# Patient Record
Sex: Female | Born: 1960 | Race: Asian | Hispanic: No | Marital: Single | Smoking: Never smoker
Health system: Southern US, Community
[De-identification: ages and names within clinical notes are randomized; demographics above are authoritative.]

## PROBLEM LIST (undated history)

## (undated) ENCOUNTER — Inpatient Hospital Stay (HOSPITAL_COMMUNITY): Payer: Self-pay

## (undated) DIAGNOSIS — E7800 Pure hypercholesterolemia, unspecified: Secondary | ICD-10-CM

## (undated) DIAGNOSIS — R7989 Other specified abnormal findings of blood chemistry: Secondary | ICD-10-CM

## (undated) DIAGNOSIS — R809 Proteinuria, unspecified: Secondary | ICD-10-CM

## (undated) DIAGNOSIS — R945 Abnormal results of liver function studies: Secondary | ICD-10-CM

## (undated) DIAGNOSIS — J309 Allergic rhinitis, unspecified: Secondary | ICD-10-CM

## (undated) DIAGNOSIS — C801 Malignant (primary) neoplasm, unspecified: Secondary | ICD-10-CM

## (undated) DIAGNOSIS — I10 Essential (primary) hypertension: Secondary | ICD-10-CM

## (undated) DIAGNOSIS — L309 Dermatitis, unspecified: Secondary | ICD-10-CM

## (undated) DIAGNOSIS — E119 Type 2 diabetes mellitus without complications: Secondary | ICD-10-CM

## (undated) DIAGNOSIS — N6019 Diffuse cystic mastopathy of unspecified breast: Secondary | ICD-10-CM

## (undated) DIAGNOSIS — E785 Hyperlipidemia, unspecified: Secondary | ICD-10-CM

## (undated) DIAGNOSIS — K219 Gastro-esophageal reflux disease without esophagitis: Secondary | ICD-10-CM

## (undated) HISTORY — DX: Pure hypercholesterolemia, unspecified: E78.00

## (undated) HISTORY — DX: Essential (primary) hypertension: I10

## (undated) HISTORY — DX: Proteinuria, unspecified: R80.9

## (undated) HISTORY — DX: Diffuse cystic mastopathy of unspecified breast: N60.19

## (undated) HISTORY — DX: Allergic rhinitis, unspecified: J30.9

## (undated) HISTORY — DX: Dermatitis, unspecified: L30.9

## (undated) HISTORY — DX: Abnormal results of liver function studies: R94.5

## (undated) HISTORY — PX: BREAST BIOPSY: SHX20

## (undated) HISTORY — DX: Hyperlipidemia, unspecified: E78.5

## (undated) HISTORY — DX: Gastro-esophageal reflux disease without esophagitis: K21.9

## (undated) HISTORY — DX: Other specified abnormal findings of blood chemistry: R79.89

## (undated) HISTORY — DX: Type 2 diabetes mellitus without complications: E11.9

---

## 2003-10-04 ENCOUNTER — Other Ambulatory Visit: Admission: RE | Admit: 2003-10-04 | Discharge: 2003-10-04 | Payer: Self-pay | Admitting: Family Medicine

## 2006-02-27 ENCOUNTER — Other Ambulatory Visit: Admission: RE | Admit: 2006-02-27 | Discharge: 2006-02-27 | Payer: Self-pay | Admitting: Family Medicine

## 2007-03-04 ENCOUNTER — Other Ambulatory Visit: Admission: RE | Admit: 2007-03-04 | Discharge: 2007-03-04 | Payer: Self-pay | Admitting: Family Medicine

## 2007-08-18 ENCOUNTER — Other Ambulatory Visit: Admission: RE | Admit: 2007-08-18 | Discharge: 2007-08-18 | Payer: Self-pay | Admitting: Family Medicine

## 2010-06-03 ENCOUNTER — Other Ambulatory Visit: Admission: RE | Admit: 2010-06-03 | Discharge: 2010-06-03 | Payer: Self-pay | Admitting: Family Medicine

## 2011-06-18 ENCOUNTER — Other Ambulatory Visit: Payer: Self-pay | Admitting: Family Medicine

## 2011-06-18 ENCOUNTER — Other Ambulatory Visit (HOSPITAL_COMMUNITY)
Admission: RE | Admit: 2011-06-18 | Discharge: 2011-06-18 | Disposition: A | Discharge status: Home / Self Care | Payer: Managed Care, Other (non HMO) | Source: Ambulatory Visit | Attending: Family Medicine | Admitting: Family Medicine

## 2011-06-18 DIAGNOSIS — Z01419 Encounter for gynecological examination (general) (routine) without abnormal findings: Secondary | ICD-10-CM | POA: Insufficient documentation

## 2012-09-13 ENCOUNTER — Other Ambulatory Visit (HOSPITAL_COMMUNITY)
Admission: RE | Admit: 2012-09-13 | Discharge: 2012-09-13 | Disposition: A | Discharge status: Home / Self Care | Payer: Managed Care, Other (non HMO) | Source: Ambulatory Visit | Attending: Family Medicine | Admitting: Family Medicine

## 2012-09-13 ENCOUNTER — Other Ambulatory Visit: Payer: Self-pay | Admitting: Family Medicine

## 2012-09-13 DIAGNOSIS — Z01419 Encounter for gynecological examination (general) (routine) without abnormal findings: Secondary | ICD-10-CM | POA: Insufficient documentation

## 2013-05-15 ENCOUNTER — Other Ambulatory Visit: Payer: Self-pay | Admitting: *Deleted

## 2013-05-15 DIAGNOSIS — E782 Mixed hyperlipidemia: Secondary | ICD-10-CM

## 2013-06-21 ENCOUNTER — Ambulatory Visit: Payer: Self-pay | Admitting: Podiatrist

## 2013-06-22 ENCOUNTER — Other Ambulatory Visit: Payer: Managed Care, Other (non HMO)

## 2013-06-24 ENCOUNTER — Ambulatory Visit: Payer: Managed Care, Other (non HMO) | Admitting: Pharmacist

## 2013-06-28 ENCOUNTER — Encounter: Payer: Self-pay | Admitting: Podiatrist

## 2013-06-28 ENCOUNTER — Ambulatory Visit (INDEPENDENT_AMBULATORY_CARE_PROVIDER_SITE_OTHER): Payer: Managed Care, Other (non HMO) | Admitting: Podiatrist

## 2013-06-28 VITALS — BP 99/59 | HR 82 | Resp 16

## 2013-06-28 DIAGNOSIS — M722 Plantar fascial fibromatosis: Secondary | ICD-10-CM

## 2013-06-28 NOTE — Patient Instructions (Signed)
Call if the orthotics aren't feeling better-- I could send them back to the lab to have them refurbished and the arches lifted even further if they need it.

## 2013-06-28 NOTE — Progress Notes (Signed)
° °  Subjective: Patient presents today stating that her orthotics have fallen a bit in the arch and her feet are starting to hurt. She received the orthotics in November of 2013 when Dr. Leeanne Deed recommended these for her plantar fasciitis. She states they have been working very well in her tennis shoes however recently they don't feel  as supportive in the arch area.  Objective: Neurovascular status is intact and unchanged from her previous visit. Neurological sensation is intact in pedal pulses are palpable. Mild plantar fasciitis symptomatology is elicited. The orthotics contour well against her arch however in the central aspect of the foot they could use some elevation.  Assessment: Plantar fasciitis which is being treated with orthotics  Plan: Adjusted the orthotics to the best of my ability to elevate the central portion of the insert. If these are not comfortable I discussed I can send him back to the lab for refurbishing and to add some arch padding. We could also consider  Orthotics if she would like. The patient will try these orthotics and will call if any adjustments need to be performed.  Marlowe Aschoff DPM

## 2013-06-30 ENCOUNTER — Other Ambulatory Visit: Payer: Self-pay | Admitting: Cardiology

## 2013-06-30 ENCOUNTER — Other Ambulatory Visit (INDEPENDENT_AMBULATORY_CARE_PROVIDER_SITE_OTHER): Payer: Managed Care, Other (non HMO)

## 2013-06-30 DIAGNOSIS — E782 Mixed hyperlipidemia: Secondary | ICD-10-CM

## 2013-07-01 LAB — NMR LIPOPROFILE WITH LIPIDS
Cholesterol, Total: 101 mg/dL (ref <200)
HDL Particle Number: 22.3 umol/L — ABNORMAL LOW (ref >=30.5)
LDL (calc): 51 mg/dL (ref <100)
LDL Particle Number: 1021 nmol/L — ABNORMAL HIGH (ref <1000)
Large HDL-P: 3 umol/L — ABNORMAL LOW (ref >=4.8)
Large VLDL-P: 2.6 nmol/L (ref <=2.7)
Small LDL Particle Number: 740 nmol/L — ABNORMAL HIGH (ref <=527)
VLDL Size: 45.5 nm (ref <=46.6)

## 2013-07-05 ENCOUNTER — Ambulatory Visit (INDEPENDENT_AMBULATORY_CARE_PROVIDER_SITE_OTHER): Payer: Managed Care, Other (non HMO) | Admitting: Pharmacist

## 2013-07-05 VITALS — Wt 146.0 lb

## 2013-07-05 DIAGNOSIS — Z79899 Other long term (current) drug therapy: Secondary | ICD-10-CM

## 2013-07-05 DIAGNOSIS — E785 Hyperlipidemia, unspecified: Secondary | ICD-10-CM

## 2013-07-05 NOTE — Assessment & Plan Note (Addendum)
Cholesterol much improved over past 6 months since adding metamucil 2 capsules bid to regimen.  LDL-P down from 1200 nmol/L to 1000 nmol/L at this time.  She is tolerating Crestor/Zetia/fish oil/metamcuil regimen well.  She has been unable to lose weight despite walking 7 days per week and eating low fat diet.  The rest of her family is shaped similar to hear with excess fat in the abdominal region.  Her glucose and cholesterol is well controlled, so will not change medication at this time, but she will continue weight loss efforts.  Will recheck labwork in 6 months, and call with results.  If she is having problems, she can call and set up a future appointment with me. Plan: 1.  Continue Crestor 10 mg, Zetia 10 mg, and fish oil 2,000 mg daily. 2.  Continue Metamucil 2 capsules twice daily. 3.  Continue walking 7 days per week, and weight loss efforts with low fat diet. 4.  Recheck cholesterol and liver function in 6 months.

## 2013-07-05 NOTE — Patient Instructions (Signed)
1.  Continue Crestor 10 mg, Zetia 10 mg, and fish oil 2,000 mg daily. 2.  Continue Metamucil 2 capsules twice daily. 3.  Continue walking 7 days per week, and weight loss efforts with low fat diet. 4.  Recheck cholesterol and liver function in 6 months.

## 2013-07-05 NOTE — Progress Notes (Signed)
Patient here for followup of elevated LDL-P, and is tolerating her Crestor 10 mg qd and Zetia 10 mg regimen well. She added Co-Q 10 and increased Crestor to 10 mg qd earlier this year, and has been doing well.  She added metamucil bid six months ago, and cholesterol has improved significantly since then, and she states her bowels are finally regular since taking this (h/o constipation).  She was diagnosed with Type 2 Diabetes recently, and her glucose is well controlled on metformin 500 mg qd.  Patient has h/o not tolerating high potency statins in the past.  Cardiac risk factors Diabetes Type 2 (recent diagnosis), family h/o CAD (brother died of MI in his 59's), HTN, low HDL; father side of family with elevated cholesterol.  Target LDL < 100 at least (prefer < 70); LDL-P goal < 1000.  Target non-HDL < 130 at least (prefer < 100).   Medications currently taking: Crestor 10 mg qd, fish oil 2 g/d, Zetia 10 mg qd, Co-Q 10 200 mg qd, Metamucil bid. Intolerance Zocor (rash), Lipitor (bad muscle aches in legs), Crestor (mild muscle aches in legs), Welchol (worsening LFTs).   Patient has h/o fatty liver on U/S, and her LFTs stay elevated chronically with or without lipid lowering meds. ALT has come back down over past few months. ALT was 120 and now down to between 50-80 U/L consistently. Patient carries her weight in the abdominal region as the rest of her family members do.  Diet patient eating almost all her meals at home. Eats out rarely. She is now drinking shakes in the morning high in fruit and fiber. She then eats a snack b/t breakfast in lunch which is a handful of walnuts or almonds. She has cut out white bread/pasta/rice, and using whole wheat now. Eating more salads as well, and lots of fish. . Weight still at 145 despite not eating out as much and being very conscious of portion size.  Exercise -  despite her plantar fasciitis, she has been able to walk 30-45 minutes most days of the week.  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  She hopes to drop 5-10 lbs.   Labs: 06/2013 - LDL-P 1021 (goal <1000), LDL 51, HDL 25, TG 125, small LDL-P 740 (Crestor 10 mg qd, Zetia 10 mg qd, fish oil 2 g/d, metamucil 2 capsules bid). 12/2012 - LDL-P 1222, LDL 59, HDL 26, TG 100, small LDL-P 1134 (Crestor 10 mg qd, Zetia 10 mg qd, fish oil 2 g/d).  Current Outpatient Prescriptions  Medication Sig Dispense Refill  . Co-Enzyme Q10 200 MG CAPS Take 200 mg by mouth.      . losartan (COZAAR) 25 MG tablet Take 25 mg by mouth daily.      . Psyllium-Calcium (METAMUCIL PLUS CALCIUM) CAPS Take 2 capsules by mouth 2 (two) times daily.      Marland Kitchen ACCU-CHEK FASTCLIX LANCETS MISC       . CRESTOR 10 MG tablet       . FLUVIRIN INJ injection       . metFORMIN (GLUCOPHAGE) 500 MG tablet Take 500 mg by mouth daily with breakfast.       . Omega-3 Fatty Acids (FISH OIL) 1200 MG CAPS Take 2,000 mg by mouth.       Marland Kitchen ZETIA 10 MG tablet        No current facility-administered medications for this visit.   Allergies  Allergen Reactions  . Zocor [Simvastatin]

## 2013-08-06 ENCOUNTER — Encounter: Payer: Self-pay | Admitting: Cardiology

## 2013-08-06 ENCOUNTER — Encounter: Payer: Self-pay | Admitting: *Deleted

## 2013-08-06 DIAGNOSIS — R809 Proteinuria, unspecified: Secondary | ICD-10-CM | POA: Insufficient documentation

## 2013-08-06 DIAGNOSIS — E119 Type 2 diabetes mellitus without complications: Secondary | ICD-10-CM | POA: Insufficient documentation

## 2013-08-06 DIAGNOSIS — E7800 Pure hypercholesterolemia, unspecified: Secondary | ICD-10-CM | POA: Insufficient documentation

## 2013-08-06 DIAGNOSIS — N6019 Diffuse cystic mastopathy of unspecified breast: Secondary | ICD-10-CM | POA: Insufficient documentation

## 2013-08-06 DIAGNOSIS — I10 Essential (primary) hypertension: Secondary | ICD-10-CM | POA: Insufficient documentation

## 2013-08-11 ENCOUNTER — Ambulatory Visit: Payer: Managed Care, Other (non HMO) | Admitting: Cardiology

## 2013-09-01 ENCOUNTER — Ambulatory Visit: Payer: Managed Care, Other (non HMO) | Admitting: Cardiology

## 2013-09-05 ENCOUNTER — Encounter: Payer: Self-pay | Admitting: Cardiology

## 2013-09-05 ENCOUNTER — Ambulatory Visit (INDEPENDENT_AMBULATORY_CARE_PROVIDER_SITE_OTHER): Payer: Managed Care, Other (non HMO) | Admitting: Cardiology

## 2013-09-05 VITALS — BP 130/70 | HR 73 | Ht 61.0 in | Wt 149.0 lb

## 2013-09-05 DIAGNOSIS — I4510 Unspecified right bundle-branch block: Secondary | ICD-10-CM

## 2013-09-05 DIAGNOSIS — E7800 Pure hypercholesterolemia, unspecified: Secondary | ICD-10-CM

## 2013-09-05 NOTE — Progress Notes (Signed)
[place] [place]. [address], [address] [place], [place]  [address] Phone: [phone] Fax:  [phone]  Date:  09/05/2013   ID:  Kirsten Hodges [facility], DOB [birth year], MRN [id number]  PCP:  Lynne Logan, MD   History of Present Illness: Kirsten Hodges is a 53 y.o. female with hyperlipidemia here for followup.  She's had intolerance to Zocor rash,  Lipitor had muscle aches in her legs,  Crestor mild muscle aches in her legs,  WelChol worsening LFTs.   She also is a history of fatty liver on ultrasound. ALT usually stays mildly elevated. We have tried Crestor 5 mg, low-dose which helped her LDL significantly but she was very fearful of going up on this agent. She is also taking coenzyme Q 10. Zetia 10mg  Also. [place] assistance.  [employer], [place].D. As encounter from 07/05/13: Patient here for followup of elevated LDL-P, and is tolerating her Crestor 10 mg qd and Zetia 10 mg regimen well. She added Co-Q 10 and increased Crestor to 10 mg qd earlier this year, and has been doing well. She added metamucil bid six months ago, and cholesterol has improved significantly since then, and she states her bowels are finally regular since taking this (h/o constipation). She was diagnosed with Type 2 Diabetes recently, and her glucose is well controlled on metformin 500 mg qd. Patient has h/o not tolerating high potency statins in the past.   Cardiac risk factors Diabetes Type 2 (recent diagnosis), family h/o CAD (brother died of MI in his 90's), HTN, low HDL; father side of family with elevated cholesterol.  Target LDL < 100 at least (prefer < 70); LDL-P goal < 1000.  Target non-HDL < 130 at least (prefer < 100).    ALT was 120 and now down to between 50-80 U/L consistently. Patient carries her weight in the abdominal region as the rest of her family members do.  Recent cold and cough.     Wt Readings from Last 3 Encounters:  07/05/13 146 lb (66.225 kg)      Past Medical History  Diagnosis Date  . Diabetes mellitus without complication   . Hypertension   . GERD (gastroesophageal reflux disease)   . Hyperlipidemia   . Microalbuminuria   . Hypercholesteremia   . Fibrocystic breast disease     Past Surgical History  Procedure Laterality Date  . Breast biopsy      Current Outpatient Prescriptions  Medication Sig Dispense Refill  . ACCU-CHEK FASTCLIX LANCETS MISC       . Co-Enzyme Q10 200 MG CAPS Take 200 mg by mouth.      . CRESTOR 10 MG tablet       . FLUVIRIN INJ injection       . losartan (COZAAR) 25 MG tablet Take 25 mg by mouth daily.      . metFORMIN (GLUCOPHAGE) 500 MG tablet Take 500 mg by mouth daily with breakfast.       . Omega-3 Fatty Acids (FISH OIL) 1200 MG CAPS Take 2,000 mg by mouth.       . Psyllium-Calcium (METAMUCIL PLUS CALCIUM) CAPS Take 2 capsules by mouth 2 (two) times daily.      Marland Kitchen ZETIA 10 MG tablet        No current facility-administered medications for this visit.    Allergies:    Allergies  Allergen Reactions  . Crestor [Rosuvastatin]     Mild leg aches  . Enalapril  cough  . Lipitor [Atorvastatin]     Muscle aches  . Welchol [Colesevelam Hcl]     Worsened LFTs  . Zocor [Simvastatin]     Social History:  The patient  reports that she has never smoked. She has never used smokeless tobacco. She reports that she does not drink alcohol or use illicit drugs.   ROS:  Please see the history of present illness.   No fevers, no chills, no orthopnea, no PND    PHYSICAL EXAM: VS:  BP 130/70  Pulse 73  SpO2 97% Well nourished, well developed, in no acute distress HEENT: normal Neck: no JVD Cardiac:  normal S1, S2; RRR; no murmur Lungs:  clear to auscultation bilaterally, no wheezing, rhonchi or rales Abd: soft, nontender, no hepatomegalymild belly fat Ext: no edema Skin: warm and dry Neuro: no focal abnormalities noted  EKG:  09/05/13:Sinus rhythm, right bundle-branch block. No significant  change from prior.  ASSESSMENT AND PLAN:  1. Hyperlipidemia-appreciate the excellent assistance of [employer], Pharm.D. Her combination of Crestor/fish oil/Metamucil seems to be working very well.LDL-P down from 1200-1000. Continue to watch liver functions. 2. Fatty liver disease-mildly elevated ALT. Continue to monitor. 3. RBBB - stable, no change. 4. I am completely comfortable at this point seeing her on an as-needed basis. I appreciate Dr. Nancy Fetter continuing to prescribe her medications and to continue with this plan. Continue to monitor her liver functions as well.  Signed, Candee Furbish, MD [facility]  09/05/2013 4:24 PM

## 2013-09-05 NOTE — Patient Instructions (Signed)
Your physician recommends that you continue on your current medications as directed. Please refer to the Current Medication list given to you today.  Follow up as needed  

## 2013-10-03 ENCOUNTER — Other Ambulatory Visit: Payer: Self-pay | Admitting: *Deleted

## 2013-10-03 MED ORDER — ROSUVASTATIN CALCIUM 10 MG PO TABS: 10.0000 mg | ORAL_TABLET | Freq: Every day | ORAL | Status: AC

## 2013-10-03 MED ORDER — EZETIMIBE 10 MG PO TABS: 10.0000 mg | ORAL_TABLET | Freq: Every day | ORAL | Status: AC

## 2013-12-28 ENCOUNTER — Other Ambulatory Visit: Payer: Managed Care, Other (non HMO)

## 2014-01-04 ENCOUNTER — Telehealth: Payer: Self-pay | Admitting: Cardiology

## 2014-01-04 NOTE — Telephone Encounter (Signed)
After Speaking With Nichole[facility] All [facility] can View each other Records, so there's No  Need In this ROI Being Processed, I will Wait on pt to Return My call and make her aware. 6.24.15/km

## 2014-01-04 NOTE — Telephone Encounter (Signed)
ROI faxed to Me From Pt she is asking For ALL records to be sent to [facility], I called [facility]  For her Wanted to Make her aware This ROI Will be Forwarded to [facility] For processing. 6.24.15/km

## 2014-12-26 ENCOUNTER — Other Ambulatory Visit (HOSPITAL_COMMUNITY)
Admission: RE | Admit: 2014-12-26 | Discharge: 2014-12-26 | Disposition: A | Discharge status: Home / Self Care | Payer: Managed Care, Other (non HMO) | Source: Ambulatory Visit | Attending: Family Medicine | Admitting: Family Medicine

## 2014-12-26 ENCOUNTER — Other Ambulatory Visit: Payer: Self-pay | Admitting: Family Medicine

## 2014-12-26 DIAGNOSIS — Z01419 Encounter for gynecological examination (general) (routine) without abnormal findings: Secondary | ICD-10-CM | POA: Diagnosis present

## 2014-12-26 DIAGNOSIS — Z1151 Encounter for screening for human papillomavirus (HPV): Secondary | ICD-10-CM | POA: Diagnosis present

## 2014-12-27 LAB — CYTOLOGY - PAP

## 2015-10-16 ENCOUNTER — Ambulatory Visit
Admission: RE | Admit: 2015-10-16 | Discharge: 2015-10-16 | Disposition: A | Discharge status: Home / Self Care | Payer: Managed Care, Other (non HMO) | Source: Ambulatory Visit | Attending: Family Medicine | Admitting: Family Medicine

## 2015-10-16 ENCOUNTER — Other Ambulatory Visit: Payer: Self-pay | Admitting: Family Medicine

## 2015-10-16 DIAGNOSIS — R05 Cough: Secondary | ICD-10-CM

## 2015-10-16 DIAGNOSIS — R059 Cough, unspecified: Secondary | ICD-10-CM

## 2015-10-31 ENCOUNTER — Other Ambulatory Visit: Payer: Self-pay | Admitting: Family Medicine

## 2015-10-31 DIAGNOSIS — E041 Nontoxic single thyroid nodule: Secondary | ICD-10-CM

## 2015-11-08 ENCOUNTER — Ambulatory Visit
Admission: RE | Admit: 2015-11-08 | Discharge: 2015-11-08 | Disposition: A | Discharge status: Home / Self Care | Payer: Managed Care, Other (non HMO) | Source: Ambulatory Visit | Attending: Family Medicine | Admitting: Family Medicine

## 2015-11-08 DIAGNOSIS — E041 Nontoxic single thyroid nodule: Secondary | ICD-10-CM

## 2015-11-12 ENCOUNTER — Other Ambulatory Visit: Payer: Self-pay | Admitting: Family Medicine

## 2015-11-12 DIAGNOSIS — E041 Nontoxic single thyroid nodule: Secondary | ICD-10-CM

## 2015-12-28 ENCOUNTER — Other Ambulatory Visit: Payer: Self-pay | Admitting: Endocrinology

## 2015-12-28 DIAGNOSIS — E041 Nontoxic single thyroid nodule: Secondary | ICD-10-CM

## 2016-01-09 ENCOUNTER — Ambulatory Visit
Admission: RE | Admit: 2016-01-09 | Discharge: 2016-01-09 | Disposition: A | Discharge status: Home / Self Care | Payer: Managed Care, Other (non HMO) | Source: Ambulatory Visit | Attending: Endocrinology | Admitting: Endocrinology

## 2016-01-09 ENCOUNTER — Other Ambulatory Visit (HOSPITAL_COMMUNITY)
Admission: RE | Admit: 2016-01-09 | Discharge: 2016-01-09 | Disposition: A | Discharge status: Home / Self Care | Payer: Managed Care, Other (non HMO) | Source: Ambulatory Visit | Attending: Radiology | Admitting: Radiology

## 2016-01-09 DIAGNOSIS — E041 Nontoxic single thyroid nodule: Secondary | ICD-10-CM

## 2016-01-09 DIAGNOSIS — E042 Nontoxic multinodular goiter: Secondary | ICD-10-CM | POA: Diagnosis not present

## 2016-04-08 ENCOUNTER — Ambulatory Visit: Payer: Managed Care, Other (non HMO) | Admitting: Allergy and Immunology

## 2016-04-09 ENCOUNTER — Encounter: Payer: Self-pay | Admitting: Allergy and Immunology

## 2016-04-09 ENCOUNTER — Ambulatory Visit (INDEPENDENT_AMBULATORY_CARE_PROVIDER_SITE_OTHER): Payer: Managed Care, Other (non HMO) | Admitting: Allergy and Immunology

## 2016-04-09 ENCOUNTER — Encounter (INDEPENDENT_AMBULATORY_CARE_PROVIDER_SITE_OTHER): Payer: Self-pay

## 2016-04-09 VITALS — BP 122/74 | HR 80 | Temp 97.8°F | Resp 16 | Ht 60.4 in | Wt 142.4 lb

## 2016-04-09 DIAGNOSIS — L509 Urticaria, unspecified: Secondary | ICD-10-CM

## 2016-04-09 DIAGNOSIS — T783XXA Angioneurotic edema, initial encounter: Secondary | ICD-10-CM | POA: Diagnosis not present

## 2016-04-09 DIAGNOSIS — K759 Inflammatory liver disease, unspecified: Secondary | ICD-10-CM | POA: Diagnosis not present

## 2016-04-09 DIAGNOSIS — J309 Allergic rhinitis, unspecified: Secondary | ICD-10-CM | POA: Diagnosis not present

## 2016-04-09 DIAGNOSIS — K219 Gastro-esophageal reflux disease without esophagitis: Secondary | ICD-10-CM | POA: Diagnosis not present

## 2016-04-09 DIAGNOSIS — H1010 Acute atopic conjunctivitis, unspecified eye: Secondary | ICD-10-CM

## 2016-04-09 MED ORDER — MONTELUKAST SODIUM 10 MG PO TABS: 10.0000 mg | ORAL_TABLET | Freq: Every day | ORAL | 5 refills | Status: DC

## 2016-04-09 NOTE — Progress Notes (Signed)
Dear Dr. Nancy Fetter,  Thank you for referring Kirsten Hodges [facility] to the [place] [facility] [place] on 04/09/2016.   Below is a summation of this patient's evaluation and recommendations.  Thank you for your referral. I will keep you informed about this patient's response to treatment.   If you have any questions please to do hesitate to contact me.   Sincerely,  Jiles Prows, MD [place]   ______________________________________________________________________    NEW PATIENT NOTE  Referring Provider: Donald Prose, MD Primary Provider: Lynne Logan, MD Date of office visit: 04/09/2016    Subjective:   Chief Complaint:  Kirsten Hodges (DOB: [birth year]) is a 55 y.o. female who presents to the clinic on 04/09/2016 with a chief complaint of Allergic Reaction (facial swelling after taking medications for a viral infection and now she gets random reactions) .     HPI: Kirsten Hodges presents  To this clinic in evaluation of recurrent problems with urticaria and angioedema starting April 2017. Apparently her initial Presentation of this problem started with very significant hand and face swelling involving both her eyes and her lips with redness at those areas. After being treated with systemic steroids and antihistamines including daily Zyrtec and Zantac she still continues to have intermittent episodes of periorbital and eyelid swelling and lip swelling once again with significant redness. She has not had any additional hand swelling since her initial event. She has no other associated systemic or constitutional symptoms. Her episodes last 1 or 2 days and never heal with scar or hyperpigmentation. There is no obvious provoking factor giving rise to this issue. She will take additional Benadryl during these episodes.  Kirsten Hodges has had pretty good health status recently. She does have a history of heartburn that's  been a long-standing issue for which she uses Zantac or Pepcid and since being on Zantac for the past 6 months she's had very good control this issue. She does have a history of allergic rhinoconjunctivitis with nasal congestion and sneezing and some itchy eyes that's under very good control with nasal fluticasone and an antihistamine. She has no anosmia or decreased ability to taste or history of recurrent sinus infections. She has a history of eczema involving her antecubital fossa and axilla and popliteal fossa that is treated intermittently with over-the-counter lotions successfully. None of the medications that she is taking at this point in time have been introduced over the course of the past year.  Kirsten Hodges informs me that she has had elevated liver function tests for many years. She does not know what type of evaluation has been performed regarding this issue. She has had 2 maternal aunts that have died from liver cancer.  Past Medical History:  Diagnosis Date  . Diabetes mellitus without complication ([place])   . Eczema   . Fibrocystic breast disease   . GERD (gastroesophageal reflux disease)   . Hypercholesteremia   . Hyperlipidemia   . Hypertension   . Microalbuminuria     Past Surgical History:  Procedure Laterality Date  . BREAST BIOPSY        Medication List      aspirin EC 81 MG tablet Take 81 mg by mouth daily.   cetirizine 10 MG tablet Commonly known as:  ZYRTEC Take 10 mg by mouth daily.   CITRACAL PO Take by mouth.   Co-Enzyme Q10 200 MG Caps Take 200 mg by mouth.  ezetimibe 10 MG tablet Commonly known as:  ZETIA Take 1 tablet (10 mg total) by mouth daily.   Fish Oil 1200 MG Caps Take 2,000 mg by mouth.   fluticasone 50 MCG/ACT nasal spray Commonly known as:  FLONASE Place 2 sprays into both nostrils daily.   FLUVIRIN Inj injection Generic drug:  influenza (>/= 3 years) inactive virus vaccine   losartan 25 MG tablet Commonly known as:  COZAAR Take  25 mg by mouth daily.   METAMUCIL PLUS CALCIUM Caps Take 2 capsules by mouth 2 (two) times daily.   metFORMIN 500 MG tablet Commonly known as:  GLUCOPHAGE Take 500 mg by mouth daily with breakfast.   MULTIVITAMIN ADULT PO Take by mouth.   ranitidine 150 MG tablet Commonly known as:  ZANTAC Take 150 mg by mouth daily.   rosuvastatin 10 MG tablet Commonly known as:  CRESTOR Take 1 tablet (10 mg total) by mouth daily.   vitamin E 400 UNIT capsule Take 400 Units by mouth daily.       Allergies  Allergen Reactions  . Crestor [Rosuvastatin]     Mild leg aches  . Enalapril     cough  . Lipitor [Atorvastatin]     Muscle aches  . Welchol [Colesevelam Hcl]     Worsened LFTs  . Zocor [Simvastatin]     Review of systems negative except as noted in HPI / PMHx or noted below:  Review of Systems  Constitutional: Negative.   HENT: Negative.   Eyes: Negative.   Respiratory: Negative.   Cardiovascular: Negative.   Gastrointestinal: Negative.   Genitourinary: Negative.   Musculoskeletal: Negative.   Skin: Negative.   Neurological: Negative.   Endo/Heme/Allergies: Negative.   Psychiatric/Behavioral: Negative.     Family History  Problem Relation Age of Onset  . Hyperlipidemia Father   . Cancer Father   . Hypertension Mother   . Hyperlipidemia Brother   . Stroke Brother     Social History   Social History  . Marital status: Single    Spouse name: N/A  . Number of children: N/A  . Years of education: N/A   Occupational History  . Not on file.   Social History Main Topics  . Smoking status: Never Smoker  . Smokeless tobacco: Never Used  . Alcohol use Yes  . Drug use: No  . Sexual activity: Not on file   Other Topics Concern  . Not on file   Social History Narrative  . No narrative on file    Environmental and Social history  Lives in a house with a dry environment, a cat and dog located inside the household, no carpeting in the bedroom, plastic on  the bed and pillow, and no smoking ongoing with inside the household. She works as a Education officer, [employer].  Objective:   Vitals:   04/09/16 0856  BP: 122/74  Pulse: 80  Resp: 16  Temp: 97.8 F (36.6 C)   Height: 5' 0.4" (153.4 cm) Weight: 142 lb 6.4 oz (64.6 kg)  Physical Exam  Constitutional: She is well-developed, well-nourished, and in no distress.  HENT:  Head: Normocephalic. Head is without right periorbital erythema and without left periorbital erythema.  Right Ear: Tympanic membrane, external ear and ear canal normal.  Left Ear: Tympanic membrane, external ear and ear canal normal.  Nose: Nose normal. No mucosal edema or rhinorrhea.  Mouth/Throat: Uvula is midline, oropharynx is clear and moist and mucous membranes are normal. No oropharyngeal exudate.  Eyes: Conjunctivae  and lids are normal. Pupils are equal, round, and reactive to light.  Neck: Trachea normal. No tracheal tenderness present. No tracheal deviation present. No thyromegaly present.  Cardiovascular: Normal rate, regular rhythm, S1 normal, S2 normal and normal heart sounds.   No murmur heard. Pulmonary/Chest: Effort normal and breath sounds normal. No stridor. No tachypnea. No respiratory distress. She has no wheezes. She has no rales. She exhibits no tenderness.  Abdominal: Soft. She exhibits no distension and no mass. There is no hepatosplenomegaly. There is no tenderness. There is no rebound and no guarding.  Musculoskeletal: She exhibits no edema or tenderness.  Lymphadenopathy:       Head (right side): No tonsillar adenopathy present.       Head (left side): No tonsillar adenopathy present.    She has no cervical adenopathy.    She has no axillary adenopathy.  Neurological: She is alert. Gait normal.  Skin: No rash noted. She is not diaphoretic. No erythema. No pallor. Nails show no clubbing.  Psychiatric: Mood and affect normal.    Diagnostics: Allergy skin tests were not performed secondary to the recent  administration of an antihistamine.    Review of blood tests forwarded by Dr. Nancy Fetter obtained on 12/31/2015 identify a AST of 49 and an ALT of 64 and a creatinine of 0.58.   Assessment and Plan:    1. Urticaria   2. Angioedema, initial encounter   3. Allergic rhinoconjunctivitis   4. Hepatitis   5. Gastroesophageal reflux disease, esophagitis presence not specified     1. Avoidance measures? - Stop all supplements including fish oil  2. Increase cetirizine to 10 mg twice a day: May add in Benadryl if needed  3. Start montelukast 10 mg one tablet once a day  4. Can continue nasal fluticasone 1-2 sprays each nostril one time per day  5. Stop ranitidine/Zantac for 10 days before breathing test. May use OTC Tums (see below)  6. Consolidate all caffeine and chocolate consumption to help with reflux control  7. Evaluation: CBC w/diff, CMP, TSH, T4, TP, antimitochondrial antibody, smooth muscle-actin antibody, LKM antibody, ANA w/reflex, hepatitis B/C screen, UA, urease breath test (10 days after stopping Zantac)  8. 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 I'll have her undergo the evaluation noted above in investigation of this immunological hyperactivity and as well in investigation of her hepatitis which has been long-standing in nature. It does appear as though this was assumed to be secondary to fatty liver but I can find no documentation of an evaluation for possible viral hepatitis or for autoimmune hepatitis. I will increase her dose of an H1 receptor blocker and have her start a leukotriene modifier in an attempt to decrease immunological hyperreactivity. Because she does have reflux disease we'll see if she has an active infection with Helicobacter pylori. I'll regroup with her over the course the next several weeks.  Jiles Prows, MD [facility]

## 2016-04-09 NOTE — Patient Instructions (Addendum)
  1. Avoidance measures? - Stop all supplements including fish oil  2. Increase cetirizine to 10 mg twice a day: May add in Benadryl if needed  3. Start montelukast 10 mg one tablet once a day  4. Can continue nasal fluticasone 1-2 sprays each nostril one time per day  5. Stop ranitidine/Zantac for 10 days before breathing test. May use OTC Tums  6. Consolidate all caffeine and chocolate consumption to help with reflux control  7. Evaluation: CBC w/diff, CMP, TSH, T4, TP, antimitochondrial antibody, smooth muscle-actin antibody, LKM antibody, ANA w/reflex, hepatitis B/C screen, alpha gal panel, UA, urease breath test (10 days after stopping Zantac)  8. Return to clinic in 2 weeks or earlier if problem

## 2016-04-11 ENCOUNTER — Telehealth: Payer: Self-pay | Admitting: Allergy and Immunology

## 2016-04-11 NOTE — Telephone Encounter (Signed)
Left message to call office

## 2016-04-11 NOTE — Telephone Encounter (Signed)
Spoke to patient and explained to her about labs that we have ordered for her to get done.

## 2016-04-11 NOTE — Telephone Encounter (Signed)
PT CALLED AND NEEDS TO TALK WITH A NURSE ABOUT HER MEDS AND HOW TO USE THEM.[phone]. BE SURE TO CALL HER LINE 1 SO SHE WILL PICK UP.

## 2016-04-15 ENCOUNTER — Encounter: Payer: Self-pay | Admitting: *Deleted

## 2016-04-15 ENCOUNTER — Other Ambulatory Visit: Payer: Self-pay | Admitting: *Deleted

## 2016-04-15 MED ORDER — CETIRIZINE HCL 10 MG PO TABS: ORAL_TABLET | ORAL | 1 refills | Status: DC

## 2016-04-22 ENCOUNTER — Telehealth: Payer: Self-pay | Admitting: Allergy and Immunology

## 2016-04-22 NOTE — Telephone Encounter (Signed)
I looked through all her tests and it would be best to get all the blood tests ordered as she does not appear to have had these completed in the past.

## 2016-04-22 NOTE — Telephone Encounter (Signed)
Pt called and wants to know if we got labs from other doctor so she will not have to take tested again.

## 2016-04-22 NOTE — Telephone Encounter (Signed)
Do you still want patient to get all blood test ordered? Please advise. Previous blood test scanned in chart.

## 2016-04-22 NOTE — Telephone Encounter (Signed)
Left message for patient to call office.  

## 2016-04-23 NOTE — Telephone Encounter (Signed)
Informed the patient that Dr. Neldon Mc wanted to get the labs repeated.

## 2016-04-23 NOTE — Telephone Encounter (Signed)
Left message to call office

## 2016-04-25 ENCOUNTER — Other Ambulatory Visit: Payer: Self-pay | Admitting: Allergy and Immunology

## 2016-04-25 LAB — COMPREHENSIVE METABOLIC PANEL
ALBUMIN: 4.5 g/dL (ref 3.6–5.1)
ALT: 54 U/L — AB (ref 6–29)
AST: 37 U/L — AB (ref 10–35)
Alkaline Phosphatase: 85 U/L (ref 33–130)
BILIRUBIN TOTAL: 0.7 mg/dL (ref 0.2–1.2)
BUN: 13 mg/dL (ref 7–25)
CHLORIDE: 105 mmol/L (ref 98–110)
CO2: 24 mmol/L (ref 20–31)
CREATININE: 0.69 mg/dL (ref 0.50–1.05)
Calcium: 9.7 mg/dL (ref 8.6–10.4)
Glucose, Bld: 144 mg/dL — ABNORMAL HIGH (ref 65–99)
Potassium: 4.5 mmol/L (ref 3.5–5.3)
SODIUM: 143 mmol/L (ref 135–146)
TOTAL PROTEIN: 7.4 g/dL (ref 6.1–8.1)

## 2016-04-25 LAB — TSH: TSH: 0.88 m[IU]/L

## 2016-04-25 LAB — CBC WITH DIFFERENTIAL/PLATELET
BASOS ABS: 0 {cells}/uL (ref 0–200)
Basophils Relative: 0 %
EOS PCT: 2 %
Eosinophils Absolute: 116 cells/uL (ref 15–500)
HCT: 41.7 % (ref 35.0–45.0)
HEMOGLOBIN: 13.6 g/dL (ref 11.7–15.5)
LYMPHS ABS: 3190 {cells}/uL (ref 850–3900)
Lymphocytes Relative: 55 %
MCH: 29.2 pg (ref 27.0–33.0)
MCHC: 32.6 g/dL (ref 32.0–36.0)
MCV: 89.5 fL (ref 80.0–100.0)
MONOS PCT: 5 %
MPV: 9.9 fL (ref 7.5–12.5)
Monocytes Absolute: 290 cells/uL (ref 200–950)
Neutro Abs: 2204 cells/uL (ref 1500–7800)
Neutrophils Relative %: 38 %
PLATELETS: 271 10*3/uL (ref 140–400)
RBC: 4.66 MIL/uL (ref 3.80–5.10)
RDW: 13.2 % (ref 11.0–15.0)
WBC: 5.8 10*3/uL (ref 3.8–10.8)

## 2016-04-25 LAB — URINALYSIS
Bilirubin Urine: NEGATIVE
Glucose, UA: NEGATIVE
HGB URINE DIPSTICK: NEGATIVE
KETONES UR: NEGATIVE
NITRITE: NEGATIVE
PH: 5.5 (ref 5.0–8.0)
PROTEIN: NEGATIVE
Specific Gravity, Urine: 1.023 (ref 1.001–1.035)

## 2016-04-25 LAB — PROTEIN, TOTAL: Total Protein: 7.4 g/dL (ref 6.1–8.1)

## 2016-04-25 LAB — T4, FREE: FREE T4: 1.1 ng/dL (ref 0.8–1.8)

## 2016-04-26 LAB — HEPATITIS C ANTIBODY: HCV AB: NEGATIVE

## 2016-04-28 LAB — H. PYLORI BREATH TEST: H. pylori Breath Test: NOT DETECTED

## 2016-04-28 LAB — HEPATITIS B E ANTIBODY: HEPATITIS BE ANTIBODY: NONREACTIVE

## 2016-04-28 LAB — ANTI-NUCLEAR AB-TITER (ANA TITER): ANA Titer 1: 1:1280 {titer} — ABNORMAL HIGH

## 2016-04-28 LAB — ANA: ANA: POSITIVE — AB

## 2016-04-28 LAB — ANTI-SMOOTH MUSCLE ANTIBODY, IGG

## 2016-04-29 LAB — ANTI-MICROSOMAL ANTIBODY LIVER / KIDNEY: LKM1 Ab: <=20 U (ref <=20.0)

## 2016-04-29 LAB — MITOCHONDRIAL ANTIBODIES: Mitochondrial M2 Ab, IgG: <=20 Units (ref <=20.0)

## 2016-04-30 LAB — ALPHA-GAL PANEL
BEEF IGE: 0.21 kU/L (ref <0.35)
Class: 0
Class: 0
GALACTOSE-ALPHA-1,3-GALACTOSE IGE: 1.13 kU/L — AB (ref <0.35)
Lamb/Mutton IgE: <0.1 kU/L (ref <0.35)
Pork IgE: <0.1 kU/L (ref <0.35)

## 2016-05-01 LAB — SJOGRENS SYNDROME-A EXTRACTABLE NUCLEAR ANTIBODY: SSA (RO) (ENA) ANTIBODY, IGG: NEGATIVE

## 2016-05-01 LAB — ANTI-SCLERODERMA ANTIBODY: Scleroderma (Scl-70) (ENA) Antibody, IgG: <1

## 2016-05-01 LAB — JO-1 ANTIBODY-IGG: Jo-1 Antibody, IgG: <1

## 2016-05-01 LAB — ANTI-SMITH ANTIBODY: ENA SM Ab Ser-aCnc: <1

## 2016-05-01 LAB — SJOGRENS SYNDROME-B EXTRACTABLE NUCLEAR ANTIBODY: SSB (LA) (ENA) ANTIBODY, IGG: NEGATIVE

## 2016-05-01 LAB — CENTROMERE ANTIBODIES: Centromere Ab Screen: <1

## 2016-05-01 LAB — RIBOSOMAL P PROTEIN AB: Ribosomal P Protein Ab: <1

## 2016-05-01 LAB — ANTI-RIBONUCLEIC ACID ANTIBODY: SM/RNP: <1

## 2016-05-01 LAB — ANTI-DNA ANTIBODY, DOUBLE-STRANDED

## 2016-05-27 ENCOUNTER — Encounter (INDEPENDENT_AMBULATORY_CARE_PROVIDER_SITE_OTHER): Payer: Self-pay

## 2016-05-27 ENCOUNTER — Ambulatory Visit (INDEPENDENT_AMBULATORY_CARE_PROVIDER_SITE_OTHER): Payer: Managed Care, Other (non HMO) | Admitting: Allergy and Immunology

## 2016-05-27 ENCOUNTER — Encounter: Payer: Self-pay | Admitting: Allergy and Immunology

## 2016-05-27 VITALS — BP 140/76 | HR 72 | Resp 16

## 2016-05-27 DIAGNOSIS — K759 Inflammatory liver disease, unspecified: Secondary | ICD-10-CM

## 2016-05-27 DIAGNOSIS — H1010 Acute atopic conjunctivitis, unspecified eye: Secondary | ICD-10-CM | POA: Diagnosis not present

## 2016-05-27 DIAGNOSIS — K219 Gastro-esophageal reflux disease without esophagitis: Secondary | ICD-10-CM | POA: Diagnosis not present

## 2016-05-27 DIAGNOSIS — J309 Allergic rhinitis, unspecified: Secondary | ICD-10-CM | POA: Diagnosis not present

## 2016-05-27 DIAGNOSIS — L509 Urticaria, unspecified: Secondary | ICD-10-CM | POA: Diagnosis not present

## 2016-05-27 NOTE — Progress Notes (Signed)
Follow-up Note  Referring Provider: Donald Prose, MD Primary Provider: Lynne Logan, MD Date of Office Visit: 05/27/2016  Subjective:   Kirsten Hodges (DOB: [birth year]) is a 55 y.o. female who returns to the [facility] [place] on 05/27/2016 in re-evaluation of the following:  [name] presents to this clinic in reevaluation of her urticaria and angioedema. Since utilizing medical therapy established on 04/09/2016 she has had complete elimination of her episodes.  As well, she's had very little issues with reflux at this point in time while consolidating any caffeine and chocolate consumption.  She has eliminated all her supplements including fish oil and coenzyme Q 10. She has continued on Zyrtec and montelukast.  Her nose is doing quite well at this point in time and she uses nasal fluticasone occasionally.  HPI:     Medication List      aspirin EC 81 MG tablet Take 81 mg by mouth daily.   cetirizine 10 MG tablet Commonly known as:  ZYRTEC Take one tablet twice daily as directed   CITRACAL PO Take by mouth.   Co-Enzyme Q10 200 MG Caps Take 200 mg by mouth.   ezetimibe 10 MG tablet Commonly known as:  ZETIA Take 1 tablet (10 mg total) by mouth daily.   Fish Oil 1200 MG Caps Take 2,000 mg by mouth.   fluticasone 50 MCG/ACT nasal spray Commonly known as:  FLONASE Place 2 sprays into both nostrils daily.   losartan 25 MG tablet Commonly known as:  COZAAR Take 25 mg by mouth daily.   METAMUCIL PLUS CALCIUM Caps Take 2 capsules by mouth 2 (two) times daily.   metFORMIN 500 MG tablet Commonly known as:  GLUCOPHAGE Take 500 mg by mouth daily with breakfast.   montelukast 10 MG tablet Commonly known as:  SINGULAIR Take 1 tablet (10 mg total) by mouth at bedtime.   MULTIVITAMIN ADULT PO Take by mouth.   ranitidine 150 MG tablet Commonly known as:  ZANTAC Take 150 mg by mouth daily.   rosuvastatin 10 MG tablet Commonly known as:   CRESTOR Take 1 tablet (10 mg total) by mouth daily.   vitamin E 400 UNIT capsule Take 400 Units by mouth daily.       Past Medical History:  Diagnosis Date  . Diabetes mellitus without complication ([place])   . Eczema   . Fibrocystic breast disease   . GERD (gastroesophageal reflux disease)   . Hypercholesteremia   . Hyperlipidemia   . Hypertension   . Microalbuminuria     Past Surgical History:  Procedure Laterality Date  . BREAST BIOPSY      Allergies  Allergen Reactions  . Crestor [Rosuvastatin]     Mild leg aches  . Enalapril     cough  . Lipitor [Atorvastatin]     Muscle aches  . Welchol [Colesevelam Hcl]     Worsened LFTs  . Zocor [Simvastatin]     Review of systems negative except as noted in HPI / PMHx or noted below:  Review of Systems  Constitutional: Negative.   HENT: Negative.   Eyes: Negative.   Respiratory: Negative.   Cardiovascular: Negative.   Gastrointestinal: Negative.   Genitourinary: Negative.   Musculoskeletal: Negative.   Skin: Negative.   Neurological: Negative.   Endo/Heme/Allergies: Negative.   Psychiatric/Behavioral: Negative.      Objective:   Vitals:   05/27/16 1720  BP: 140/76  Pulse: 72  Resp: 16  Physical Exam  Constitutional: She is well-developed, well-nourished, and in no distress.  HENT:  Head: Normocephalic.  Right Ear: Tympanic membrane, external ear and ear canal normal.  Left Ear: Tympanic membrane, external ear and ear canal normal.  Nose: Nose normal. No mucosal edema or rhinorrhea.  Mouth/Throat: Uvula is midline, oropharynx is clear and moist and mucous membranes are normal. No oropharyngeal exudate.  Eyes: Conjunctivae are normal.  Neck: Trachea normal. No tracheal tenderness present. No tracheal deviation present. No thyromegaly present.  Cardiovascular: Normal rate, regular rhythm, S1 normal, S2 normal and normal heart sounds.   No murmur heard. Pulmonary/Chest: Breath sounds normal. No  stridor. No respiratory distress. She has no wheezes. She has no rales.  Musculoskeletal: She exhibits no edema.  Lymphadenopathy:       Head (right side): No tonsillar adenopathy present.       Head (left side): No tonsillar adenopathy present.    She has no cervical adenopathy.  Neurological: She is alert. Gait normal.  Skin: No rash noted. She is not diaphoretic. No erythema. Nails show no clubbing.  Psychiatric: Mood and affect normal.    Diagnostics: Results of blood tests obtained on 04/25/2016 identified a negative Helicobacter pylori breath test, negative hepatitis B screen, negative hepatitis C screen, positive ANA at 1:1280 with atypical speckled pattern, normal TSH, normal T4,White blood cell count of 5.8 with normal differential, hemoglobin 13.6 with platelet count 271, AST 37, ALT 54  Assessment and Plan:   1. Urticaria   2. Allergic rhinoconjunctivitis   3. Hepatitis   4. Gastroesophageal reflux disease, esophagitis presence not specified     1. Continue cetirizine to 10 mg twice a day: May add in Benadryl if needed  2. Continue montelukast 10 mg one tablet once a day  3. Can continue nasal fluticasone 1-2 sprays each nostril one time per day  4. Continue Ranitidine 150 one tablet two times per day  5. Blood - antimitochondrial antibody, smooth muscle-actin antibody, LKM antibody  6. Return to clinic in January 2018 or earlier if problem   [name] is doing quite well with her immunological hyperreactivity on I am going to continue to have her use cetirizine and montelukast and ranitidine on a consistent basis and she can use nasal fluticasone for her upper airway disease as needed. I'm going to have her remain off all her supplements at this point in time. 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 I'll see her back in this clinic in January 2018 or earlier if there  is a problem.  Allena Katz, MD [place]

## 2016-05-27 NOTE — Patient Instructions (Addendum)
  1. Continue cetirizine to 10 mg twice a day: May add in Benadryl if needed  2. Continue montelukast 10 mg one tablet once a day  3. Can continue nasal fluticasone 1-2 sprays each nostril one time per day  4. Continue Ranitidine 150 one tablet two times per day  5. Blood - antimitochondrial antibody, smooth muscle-actin antibody, LKM antibody  6. Return to clinic in January 2018 or earlier if problem

## 2016-06-05 LAB — ANTI-MICROSOMAL ANTIBODY LIVER / KIDNEY: LKM1 Ab: 2.2 Units (ref 0.0–20.0)

## 2016-06-05 LAB — MITOCHONDRIAL ANTIBODIES: Mitochondrial Ab: 11 Units (ref 0.0–20.0)

## 2016-06-05 LAB — ANTI-SMOOTH MUSCLE ANTIBODY, IGG: Smooth Muscle Ab: 7 Units (ref 0–19)

## 2016-06-09 ENCOUNTER — Telehealth: Payer: Self-pay | Admitting: *Deleted

## 2016-06-09 NOTE — Telephone Encounter (Signed)
-----   Message from Jiles Prows, MD sent at 06/09/2016  8:40 AM EST ----- Please ask patient if she has an EpiPen or a Auvi-Q injector for her mammal allergy. And is she avoiding mammal? Also, all her blood tests for autoimmune hepatitis came back negative except she does appear to have a positive ANA and we can recheck the titer of this blood tests in 3 months.

## 2016-06-10 NOTE — Telephone Encounter (Signed)
LM for patient to contact office.

## 2016-06-12 ENCOUNTER — Telehealth: Payer: Self-pay | Admitting: Allergy and Immunology

## 2016-06-12 NOTE — Telephone Encounter (Signed)
Please inform patient that some of her reactions may be secondary to the consumption of mammal and it may be possible for her to come off all her medications if she becomes mammal consumption free. This is not assured but it is possible that if she became mammal consumption free a lot of her issue would resolve and she may not need to use any medicines. Please let us know what she would like to do. Would she like to remain on medications? Or which she like to become mammal free and then slowly taper down her medications?

## 2016-06-12 NOTE — Telephone Encounter (Signed)
Returning a nurse phone call.

## 2016-06-12 NOTE — Telephone Encounter (Signed)
See note on previous telephone call

## 2016-06-12 NOTE — Telephone Encounter (Signed)
Left message to return call 

## 2016-06-12 NOTE — Telephone Encounter (Signed)
Patient is eating mammal without difficulty. She does not have epipen do you want one sent in? Patient is ok with nurses leaving message advising of this.

## 2016-06-16 NOTE — Telephone Encounter (Signed)
LM for patient to contact office.

## 2016-07-11 ENCOUNTER — Other Ambulatory Visit: Payer: Self-pay

## 2016-07-11 MED ORDER — CETIRIZINE HCL 10 MG PO TABS: ORAL_TABLET | ORAL | 1 refills | Status: DC

## 2016-07-11 NOTE — Telephone Encounter (Signed)
Patient called the pharmacy to get her zyrtec refilled and was told to contact us due there being no more refills on her zyrtec.  Patient uses Psychiatric nurse.   Please Advise

## 2016-07-11 NOTE — Telephone Encounter (Signed)
Zyrtec refilled

## 2016-08-05 ENCOUNTER — Ambulatory Visit: Payer: Managed Care, Other (non HMO) | Admitting: Allergy and Immunology

## 2016-08-19 ENCOUNTER — Encounter: Payer: Self-pay | Admitting: Allergy and Immunology

## 2016-08-19 ENCOUNTER — Encounter (INDEPENDENT_AMBULATORY_CARE_PROVIDER_SITE_OTHER): Payer: Self-pay

## 2016-08-19 ENCOUNTER — Ambulatory Visit (INDEPENDENT_AMBULATORY_CARE_PROVIDER_SITE_OTHER): Payer: Managed Care, Other (non HMO) | Admitting: Allergy and Immunology

## 2016-08-19 VITALS — BP 108/68 | HR 72 | Resp 18

## 2016-08-19 DIAGNOSIS — L509 Urticaria, unspecified: Secondary | ICD-10-CM

## 2016-08-19 DIAGNOSIS — K759 Inflammatory liver disease, unspecified: Secondary | ICD-10-CM | POA: Diagnosis not present

## 2016-08-19 DIAGNOSIS — H1010 Acute atopic conjunctivitis, unspecified eye: Secondary | ICD-10-CM

## 2016-08-19 DIAGNOSIS — K219 Gastro-esophageal reflux disease without esophagitis: Secondary | ICD-10-CM | POA: Diagnosis not present

## 2016-08-19 DIAGNOSIS — J309 Allergic rhinitis, unspecified: Secondary | ICD-10-CM | POA: Diagnosis not present

## 2016-08-19 MED ORDER — AZELASTINE-FLUTICASONE 137-50 MCG/ACT NA SUSP: NASAL | 5 refills | Status: DC

## 2016-08-19 NOTE — Patient Instructions (Signed)
  1. Decrease cetirizine to 10 mg once a day: May add in Benadryl if needed  2. Continue montelukast 10 mg one tablet once a day  3. Change Flonase to Dymista one spray each nostril twice a day  4. Continue Ranitidine 150 one tablet two times per day  5. Return to clinic in 6 months or earlier if problem

## 2016-08-19 NOTE — Progress Notes (Signed)
Follow-up Note  Referring Provider: Donald Prose, MD Primary Provider: Lynne Logan, MD Date of Office Visit: 08/19/2016  Subjective:   Kirsten Hodges (DOB: September 10, 1960) is a 56 y.o. female who returns to the [facility] [place] on 08/19/2016 in re-evaluation of the following:  HPI: Kirsten Hodges returns to this clinic in evaluation of her episodes of urticaria and angioedema. I last saw her in his clinic on 05/27/2016.  She has really done quite well and has had no urticaria. She's had no episodes of angioedema.She is now eating mammal with no problem  As well, since she eliminated all her caffeine and chocolate consumption she has had no issues with reflux.  She does believe that her nose has still been a little bit stuffy and she has an occasional runny nose and some sneezing even though she uses nasal fluticasone.  In investigation of her elevated liver function tests she did have follow-up blood studies during her last evaluation all of which were normal.  Allergies as of 08/19/2016      Reactions   Crestor [rosuvastatin]    Mild leg aches   Enalapril    cough   Lipitor [atorvastatin]    Muscle aches   Welchol [colesevelam Hcl]    Worsened LFTs   Zocor [simvastatin]       Medication List      aspirin EC 81 MG tablet Take 81 mg by mouth daily.   cetirizine 10 MG tablet Commonly known as:  ZYRTEC Take one tablet twice daily as directed   ezetimibe 10 MG tablet Commonly known as:  ZETIA Take 1 tablet (10 mg total) by mouth daily.   fluticasone 50 MCG/ACT nasal spray Commonly known as:  FLONASE Place 2 sprays into both nostrils daily.   losartan 25 MG tablet Commonly known as:  COZAAR Take 25 mg by mouth daily.   metFORMIN 500 MG tablet Commonly known as:  GLUCOPHAGE Take 500 mg by mouth daily with breakfast.   montelukast 10 MG tablet Commonly known as:  SINGULAIR Take 1 tablet (10 mg total) by mouth at bedtime.   ranitidine 150 MG  tablet Commonly known as:  ZANTAC Take 150 mg by mouth daily.   rosuvastatin 10 MG tablet Commonly known as:  CRESTOR Take 1 tablet (10 mg total) by mouth daily.       Past Medical History:  Diagnosis Date  . Diabetes mellitus without complication ([place])   . Eczema   . Fibrocystic breast disease   . GERD (gastroesophageal reflux disease)   . Hypercholesteremia   . Hyperlipidemia   . Hypertension   . Microalbuminuria     Past Surgical History:  Procedure Laterality Date  . BREAST BIOPSY      Review of systems negative except as noted in HPI / PMHx or noted below:  Review of Systems  Constitutional: Negative.   HENT: Negative.   Eyes: Negative.   Respiratory: Negative.   Cardiovascular: Negative.   Gastrointestinal: Negative.   Genitourinary: Negative.   Musculoskeletal: Negative.   Skin: Negative.   Neurological: Negative.   Endo/Heme/Allergies: Negative.   Psychiatric/Behavioral: Negative.      Objective:   Vitals:   08/19/16 1621  BP: 108/68  Pulse: 72  Resp: 18          Physical Exam  Constitutional: She is well-developed, well-nourished, and in no distress.  HENT:  Head: Normocephalic.  Right Ear: Tympanic membrane, external ear and ear canal normal.  Left Ear:  Tympanic membrane, external ear and ear canal normal.  Nose: Nose normal. No mucosal edema or rhinorrhea.  Mouth/Throat: Uvula is midline, oropharynx is clear and moist and mucous membranes are normal. No oropharyngeal exudate.  Eyes: Conjunctivae are normal.  Neck: Trachea normal. No tracheal tenderness present. No tracheal deviation present. No thyromegaly present.  Cardiovascular: Normal rate, regular rhythm, S1 normal, S2 normal and normal heart sounds.   No murmur heard. Pulmonary/Chest: Breath sounds normal. No stridor. No respiratory distress. She has no wheezes. She has no rales.  Musculoskeletal: She exhibits no edema.  Lymphadenopathy:       Head (right side): No tonsillar  adenopathy present.       Head (left side): No tonsillar adenopathy present.    She has no cervical adenopathy.  Neurological: She is alert. Gait normal.  Skin: No rash noted. She is not diaphoretic. No erythema. Nails show no clubbing.  Psychiatric: Mood and affect normal.    Diagnostics: results of blood tests obtained on 06/03/2016 identified a negative LK and antibody, negative mitochondrial antibody, and negative smooth muscle antibody.   Assessment and Plan:   1. Urticaria   2. Allergic rhinoconjunctivitis   3. Gastroesophageal reflux disease, esophagitis presence not specified   4. Hepatitis     1. Decrease cetirizine to 10 mg once a day: May add in Benadryl if needed  2. Continue montelukast 10 mg one tablet once a day  3. Change Flonase to Dymista one spray each nostril twice a day  4. Continue Ranitidine 150 one tablet two times per day  5. Return to clinic in 6 months or earlier if problem   Kirsten Hodges appears to be doing quite well and we'll now decrease her dose of cetirizine as noted above and see how she does with her chronic urticaria. I did give her Dymista to see if this works a little better for her upper airway disease compared to [facility]. She'll continue to use ranitidine on a consistent basis as well. I'm not sure she requires any further evaluation for her elevated liver function tests as most of her screening studies have been negative other than her positive ANA. She can follow-up with her primary care doctor concerning further evaluation of this issue.  Allena Katz, MD Allergy / Immunology [place]

## 2016-09-23 ENCOUNTER — Other Ambulatory Visit: Payer: Self-pay | Admitting: *Deleted

## 2016-09-23 MED ORDER — MONTELUKAST SODIUM 10 MG PO TABS: 10.0000 mg | ORAL_TABLET | Freq: Every day | ORAL | 5 refills | Status: DC

## 2016-12-31 ENCOUNTER — Other Ambulatory Visit: Payer: Self-pay | Admitting: Endocrinology

## 2016-12-31 DIAGNOSIS — E049 Nontoxic goiter, unspecified: Secondary | ICD-10-CM

## 2017-02-16 ENCOUNTER — Other Ambulatory Visit: Payer: Managed Care, Other (non HMO)

## 2017-03-18 ENCOUNTER — Ambulatory Visit
Admission: RE | Admit: 2017-03-18 | Discharge: 2017-03-18 | Disposition: A | Discharge status: Home / Self Care | Payer: Managed Care, Other (non HMO) | Source: Ambulatory Visit | Attending: Endocrinology | Admitting: Endocrinology

## 2017-03-18 DIAGNOSIS — E049 Nontoxic goiter, unspecified: Secondary | ICD-10-CM

## 2017-03-28 IMAGING — US US THYROID BIOPSY
1 series · 13 of 25 positions shown · non-contrast
Comparison: US Thyroid 11/08/2015

MEDICATIONS:
8 cc 1% lidocaine

COMPLICATIONS:
None immediate.

INDICATION: Indeterminate thyroid nodules

Left thyroid nodule:  2.5 cm x 1.0 cm x 1.2 cm
Right thyroid nodule:  3.2 cm x 1.7 cm x 0.7 cm
EXAM:
ULTRASOUND GUIDED THYROID FINE NEEDLE ASPIRATION x2
TECHNIQUE: Informed written consent was obtained from the patient after a
discussion of the risks, benefits and alternatives to treatment.
Questions regarding the procedure were encouraged and answered. A
timeout was performed prior to the initiation of the procedure.

[Series 1: us thyroid biopsy · 0.07mm/px · 31 acquisitions, 13 frames shown]
[im 1/31]
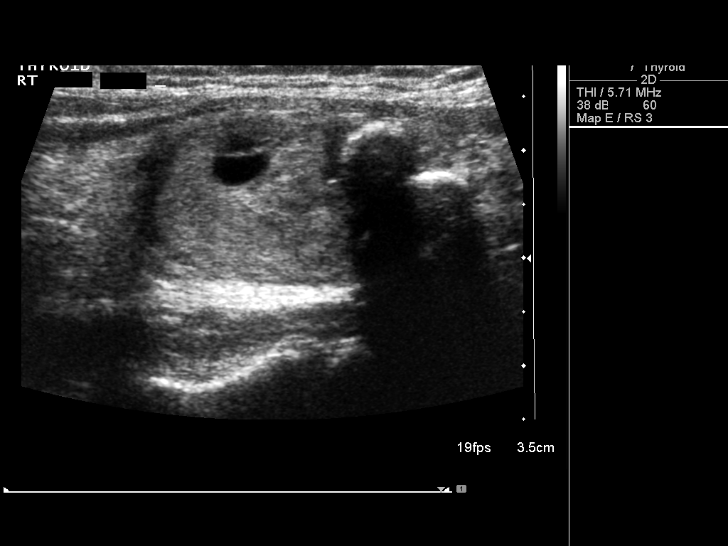
[im 3/31]
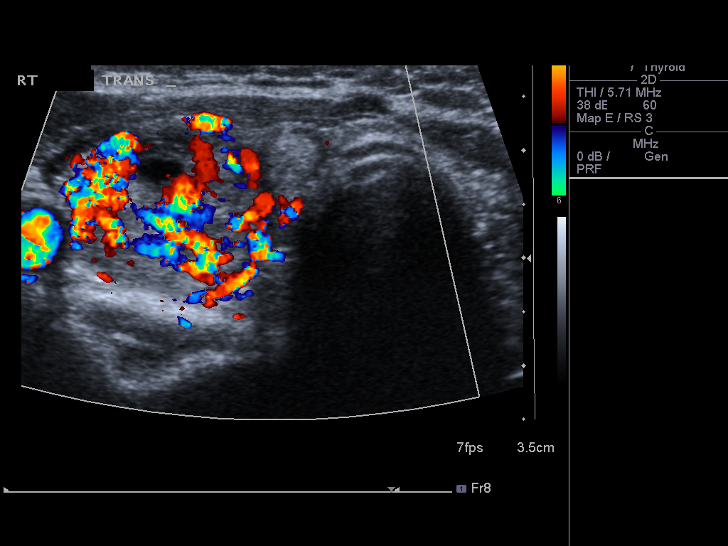
[im 6/31]
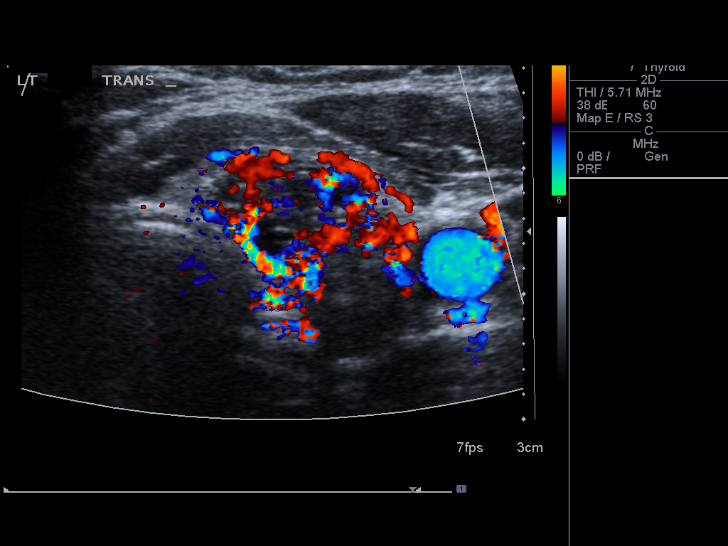
[im 8/31]
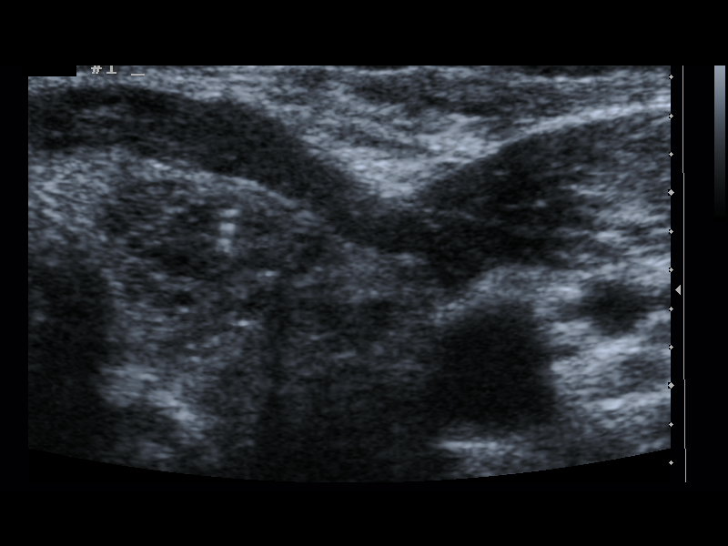
[im 11/31]
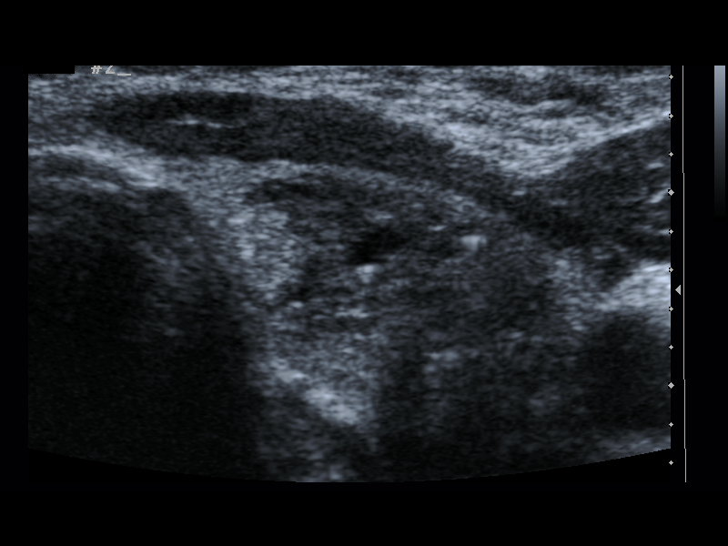
[im 13/31]
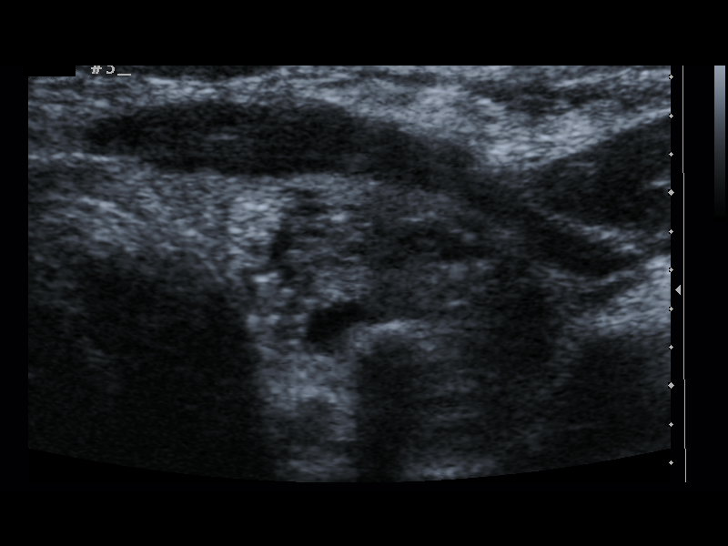
[im 16/31]
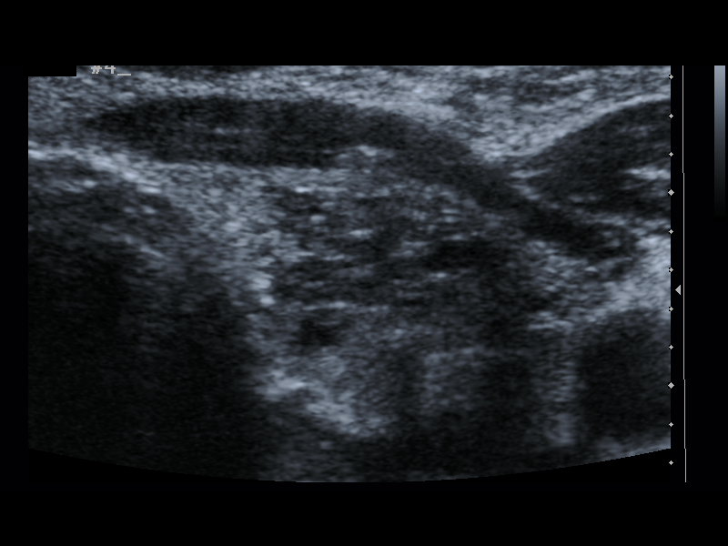
[im 18/31]
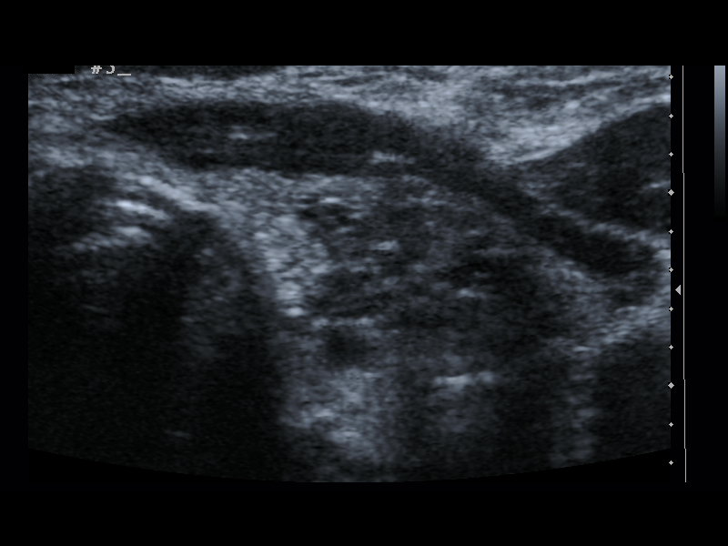
[im 21/31]
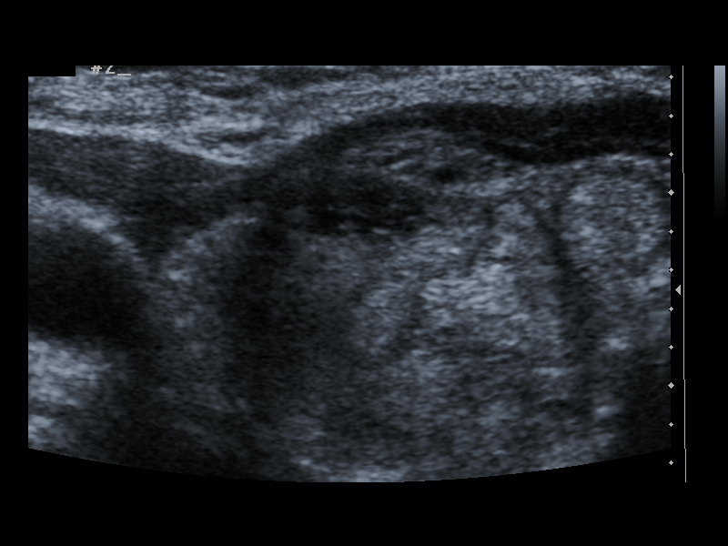
[im 23/31]
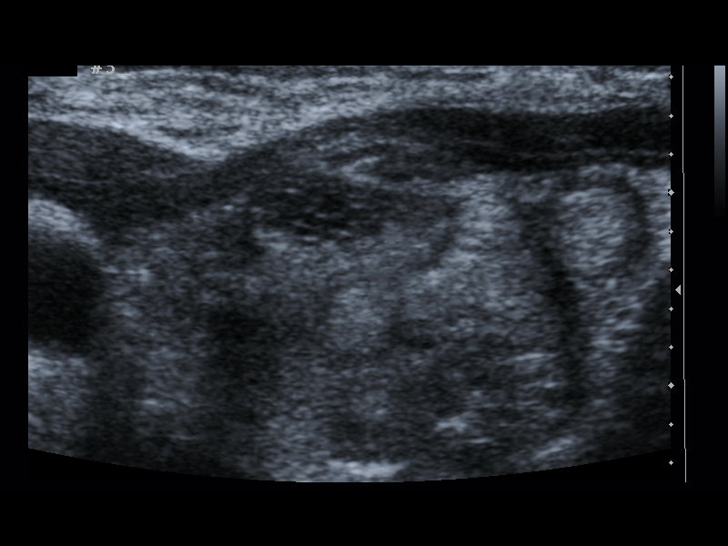
[im 26/31]
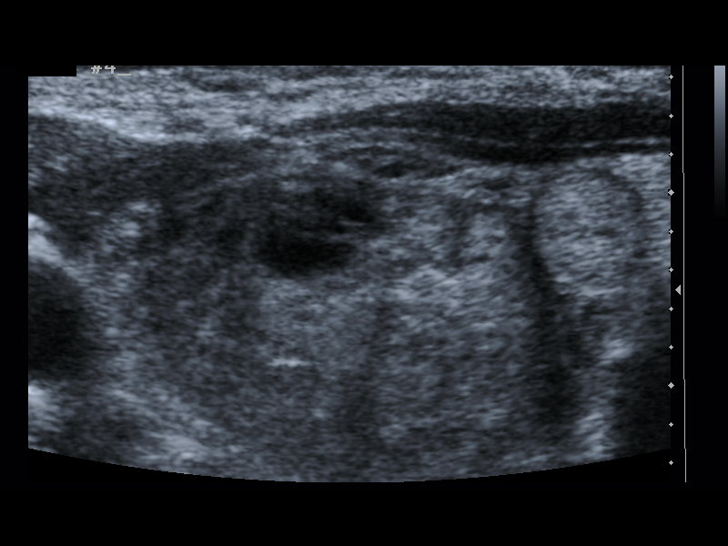
[im 28/31]
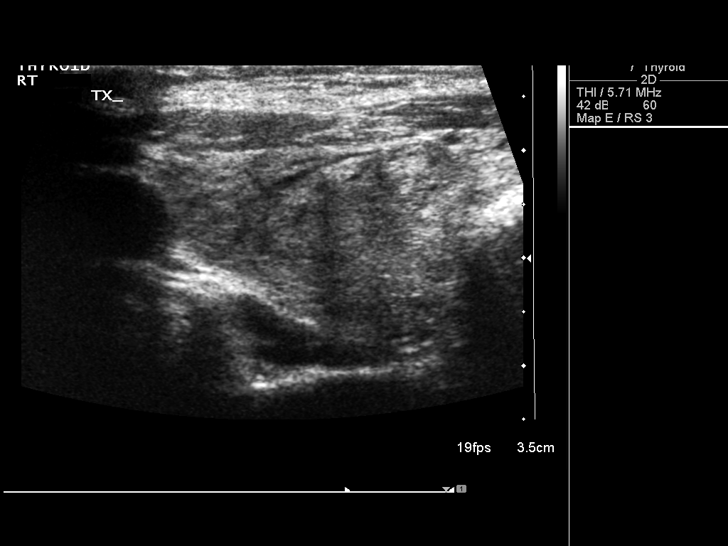
[im 31/31]
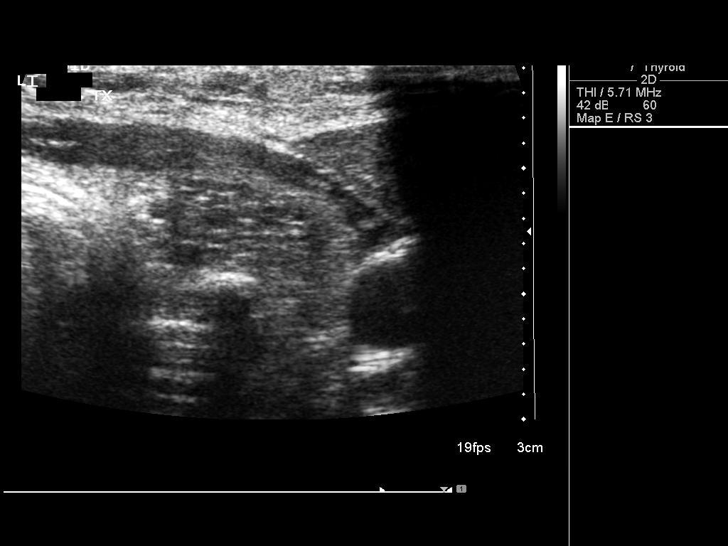

[13 of 25 positions shown; findings below may reference images not displayed]

Pre-procedural ultrasound scanning demonstrated Left and right
thyroid nodules

The procedures were planned. The neck was prepped in the usual
sterile fashion, and a sterile drape was applied covering the
operative field. A timeout was performed prior to the initiation of
the procedure. Local anesthesia was provided with 1% lidocaine.

Under direct ultrasound guidance, 3 FNA biopsies were performed of
the left with a 25 gauge needle.

2 additional FNA biopsies performed for [place] per [facility].

The samples were prepared and submitted to pathology.

Under direct ultrasound guidance, 3 FNA biopsies were performed of
the right with a 25 gauge needle.

2 additional FNA biopsies performed for [place] per [facility].

The samples were prepared and submitted to pathology.

Limited post procedural scanning was negative for hematoma or
additional complication. Dressings were placed. The patient
tolerated the above procedures procedure well without immediate
postprocedural complication.
IMPRESSION: Technically successful ultrasound guided fine needle aspiration of
left and right thyroid nodules

Read by:  Falasteen Sentu

## 2017-04-22 ENCOUNTER — Other Ambulatory Visit: Payer: Self-pay | Admitting: *Deleted

## 2017-04-22 MED ORDER — MONTELUKAST SODIUM 10 MG PO TABS: 10.0000 mg | ORAL_TABLET | Freq: Every day | ORAL | 3 refills | Status: DC

## 2017-04-23 ENCOUNTER — Encounter: Payer: Self-pay | Admitting: Allergy and Immunology

## 2017-04-23 ENCOUNTER — Ambulatory Visit (INDEPENDENT_AMBULATORY_CARE_PROVIDER_SITE_OTHER): Payer: Managed Care, Other (non HMO) | Admitting: Allergy and Immunology

## 2017-04-23 VITALS — BP 120/70 | HR 92 | Resp 18

## 2017-04-23 DIAGNOSIS — R945 Abnormal results of liver function studies: Secondary | ICD-10-CM | POA: Diagnosis not present

## 2017-04-23 DIAGNOSIS — L509 Urticaria, unspecified: Secondary | ICD-10-CM | POA: Diagnosis not present

## 2017-04-23 DIAGNOSIS — K219 Gastro-esophageal reflux disease without esophagitis: Secondary | ICD-10-CM | POA: Diagnosis not present

## 2017-04-23 DIAGNOSIS — R7989 Other specified abnormal findings of blood chemistry: Secondary | ICD-10-CM

## 2017-04-23 DIAGNOSIS — J3089 Other allergic rhinitis: Secondary | ICD-10-CM

## 2017-04-23 NOTE — Progress Notes (Signed)
Follow-up Note  Referring Provider: Donald Prose, MD Primary Provider: Donald Prose, MD Date of Office Visit: 04/23/2017  Subjective:   Kirsten [facility] (DOB: [birth year]) is a 56 y.o. female who returns to the [facility] on 04/23/2017 in re-evaluation of the following:  HPI: Kirsten Hodges returns to this clinic in reevaluation of her urticaria and angioedema and elevated liver function tests and reflux. I last saw her in this clinic in February 2018.  She has had no outbreaks of urticaria or angioedema. She continues on a H1 and H2 receptor blocker and a leukotriene modifier.  She has had no problems with her nose. Occasionally she will use a combination nasal steroid and antihistamine which is working quite well.  She has had no problems with reflux. She hass eliminated all caffeine consumption and occasionally has some chocolate consumption.  Her elevated liver function tests are now being followed by her primary care doctor. In review, she has asymptomatic elevated liver function tests with both AST and ALT elevation without evidence of autoimmune hepatitis or viral hepatitis other than a elevated ANA with speckled pattern at 1:1280 with a negative ENA.  Allergies as of 04/23/2017      Reactions   Enalapril    cough   Lipitor [atorvastatin]    Muscle aches   Welchol [colesevelam Hcl]    Worsened LFTs   Zocor [simvastatin]       Medication List      aspirin EC 81 MG tablet Take 81 mg by mouth daily.   Azelastine-Fluticasone 137-50 MCG/ACT Susp Commonly known as:  DYMISTA Use one spray in each nostril twice a day.   cetirizine 10 MG tablet Commonly known as:  ZYRTEC Take one tablet twice daily as directed   ezetimibe 10 MG tablet Commonly known as:  ZETIA Take 1 tablet (10 mg total) by mouth daily.   JANUVIA 100 MG tablet Generic drug:  sitaGLIPtin   losartan 25 MG tablet Commonly known as:  COZAAR Take 25 mg by mouth daily.   METAMUCIL  PO Take by mouth.   metFORMIN 500 MG tablet Commonly known as:  GLUCOPHAGE Take 500 mg by mouth daily with breakfast.   montelukast 10 MG tablet Commonly known as:  SINGULAIR Take 1 tablet (10 mg total) by mouth at bedtime.   ranitidine 150 MG tablet Commonly known as:  ZANTAC Take 150 mg by mouth daily.   rosuvastatin 10 MG tablet Commonly known as:  CRESTOR Take 1 tablet (10 mg total) by mouth daily.       Past Medical History:  Diagnosis Date  . Allergic rhinitis   . Diabetes mellitus without complication ([place])   . Eczema   . Elevated liver function tests   . Fibrocystic breast disease   . GERD (gastroesophageal reflux disease)   . Hypercholesteremia   . Hyperlipidemia   . Hypertension   . Microalbuminuria     Past Surgical History:  Procedure Laterality Date  . BREAST BIOPSY      Review of systems negative except as noted in HPI / PMHx or noted below:  Review of Systems  Constitutional: Negative.   HENT: Negative.   Eyes: Negative.   Respiratory: Negative.   Cardiovascular: Negative.   Gastrointestinal: Negative.   Genitourinary: Negative.   Musculoskeletal: Negative.   Skin: Negative.   Neurological: Negative.   Endo/Heme/Allergies: Negative.   Psychiatric/Behavioral: Negative.      Objective:   Vitals:   04/23/17 1608  BP:  120/70  Pulse: 92  Resp: 18          Physical Exam  Constitutional: She is well-developed, well-nourished, and in no distress.  HENT:  Head: Normocephalic.  Right Ear: Tympanic membrane, external ear and ear canal normal.  Left Ear: Tympanic membrane, external ear and ear canal normal.  Nose: Nose normal. No mucosal edema or rhinorrhea.  Mouth/Throat: Uvula is midline, oropharynx is clear and moist and mucous membranes are normal. No oropharyngeal exudate.  Eyes: Conjunctivae are normal.  Neck: Trachea normal. No tracheal tenderness present. No tracheal deviation present. No thyromegaly present.  Cardiovascular:  Normal rate, regular rhythm, S1 normal, S2 normal and normal heart sounds.   No murmur heard. Pulmonary/Chest: Breath sounds normal. No stridor. No respiratory distress. She has no wheezes. She has no rales.  Musculoskeletal: She exhibits no edema.  Lymphadenopathy:       Head (right side): No tonsillar adenopathy present.       Head (left side): No tonsillar adenopathy present.    She has no cervical adenopathy.  Neurological: She is alert. Gait normal.  Skin: No rash noted. She is not diaphoretic. No erythema. Nails show no clubbing.  Psychiatric: Mood and affect normal.    Diagnostics: none   Assessment and Plan:   1. Urticaria   2. Other allergic rhinitis   3. Gastroesophageal reflux disease, esophagitis presence not specified   4. Elevated liver function tests     1. Continue cetirizine 10 - 20 mg once a day: May add in Benadryl if needed  2. Continue montelukast 10 mg one tablet once a day  3. Continue Dymista one spray each nostril twice a day if needed  4. Continue Ranitidine 150 one tablet two times per day  5. Return to clinic in 6 months or earlier if problem   6. Obtain fall flu vaccine  Overall Kirsten Hodges is doing quite well. She will continue to utilize the plan mentioned above which includes very safe H1 and H2 receptor blocker and a leukotriene modifier and the as needed use of a nasal antihistamine and steroid. I will see her back in this clinic in 6 months or earlier if there is a problem. She will continue to have follow-up with her primary care doctor regarding her elevated liver function tests.  Kirsten Katz, MD Allergy / Immunology [place]

## 2017-04-23 NOTE — Patient Instructions (Addendum)
  1. Continue cetirizine 10 - 20 mg once a day: May add in Benadryl if needed  2. Continue montelukast 10 mg one tablet once a day  3. Continue Dymista one spray each nostril twice a day if needed  4. Continue Ranitidine 150 one tablet two times per day  5. Return to clinic in 6 months or earlier if problem   6. Obtain fall flu vaccine

## 2017-08-24 ENCOUNTER — Other Ambulatory Visit: Payer: Self-pay | Admitting: *Deleted

## 2017-08-24 MED ORDER — MONTELUKAST SODIUM 10 MG PO TABS: ORAL_TABLET | ORAL | 5 refills | Status: DC

## 2017-09-18 ENCOUNTER — Ambulatory Visit
Admission: RE | Admit: 2017-09-18 | Discharge: 2017-09-18 | Disposition: A | Discharge status: Home / Self Care | Payer: Managed Care, Other (non HMO) | Source: Ambulatory Visit | Attending: Family Medicine | Admitting: Family Medicine

## 2017-09-18 ENCOUNTER — Other Ambulatory Visit: Payer: Self-pay | Admitting: Family Medicine

## 2017-09-18 DIAGNOSIS — R1031 Right lower quadrant pain: Secondary | ICD-10-CM

## 2017-09-18 MED ORDER — IOPAMIDOL (ISOVUE-300) INJECTION 61%
100.0000 mL | Freq: Once | INTRAVENOUS | Status: AC | PRN
  Administered 2017-09-18: 100 mL via INTRAVENOUS

## 2017-09-21 ENCOUNTER — Other Ambulatory Visit: Payer: Self-pay | Admitting: Family Medicine

## 2017-09-21 DIAGNOSIS — R1031 Right lower quadrant pain: Secondary | ICD-10-CM

## 2017-10-22 ENCOUNTER — Encounter: Payer: Self-pay | Admitting: Allergy and Immunology

## 2017-10-22 ENCOUNTER — Ambulatory Visit: Payer: Managed Care, Other (non HMO) | Admitting: Allergy and Immunology

## 2017-10-22 VITALS — BP 130/64 | HR 80 | Resp 20

## 2017-10-22 DIAGNOSIS — K219 Gastro-esophageal reflux disease without esophagitis: Secondary | ICD-10-CM

## 2017-10-22 DIAGNOSIS — L308 Other specified dermatitis: Secondary | ICD-10-CM

## 2017-10-22 DIAGNOSIS — J3089 Other allergic rhinitis: Secondary | ICD-10-CM | POA: Diagnosis not present

## 2017-10-22 DIAGNOSIS — L509 Urticaria, unspecified: Secondary | ICD-10-CM

## 2017-10-22 DIAGNOSIS — L989 Disorder of the skin and subcutaneous tissue, unspecified: Secondary | ICD-10-CM

## 2017-10-22 MED ORDER — MOMETASONE FUROATE 0.1 % EX OINT: TOPICAL_OINTMENT | CUTANEOUS | 5 refills | Status: DC

## 2017-10-22 NOTE — Progress Notes (Signed)
Follow-up Note  Referring Provider: Donald Prose, MD Primary Provider: Donald Prose, MD Date of Office Visit: 10/22/2017  Subjective:   Kirsten Hodges [facility] (DOB: 06/11/1961) is a 57 y.o. female who returns to the [facility] on 10/22/2017 in re-evaluation of the following:  HPI: Kirsten Hodges presents to this clinic in reevaluation of her urticaria and angioedema and reflux and elevated liver function tests and dermatitis.  I last saw her in this clinic 23 April 2017.  She has had excellent control of her skin condition while consistently using a combination of an H1 and H2 receptor blocker and a leukotriene modifier.  She has had very little problems with her nose while also utilizing this plan and is not required an antibiotic to treat an episode of sinusitis.  She does not have any issues with reflux at this point in time.  She continues to follow-up with her primary care doctor regarding her elevated liver function tests.  She has developed a itchy dermatitis on her right elbow which is a recurrent issue that occurs a few times per year.  Allergies as of 10/22/2017      Reactions   Enalapril    cough   Lipitor [atorvastatin]    Muscle aches   Welchol [colesevelam Hcl]    Worsened LFTs   Zocor [simvastatin]       Medication List      aspirin EC 81 MG tablet Take 81 mg by mouth daily.   Azelastine-Fluticasone 137-50 MCG/ACT Susp Commonly known as:  DYMISTA Use one spray in each nostril twice a day.   cetirizine 10 MG tablet Commonly known as:  ZYRTEC Take one tablet twice daily as directed   ezetimibe 10 MG tablet Commonly known as:  ZETIA Take 1 tablet (10 mg total) by mouth daily.   JANUVIA 100 MG tablet Generic drug:  sitaGLIPtin   losartan 25 MG tablet Commonly known as:  COZAAR Take 25 mg by mouth daily.   METAMUCIL PO Take by mouth.   metFORMIN 500 MG tablet Commonly known as:  GLUCOPHAGE Take 500 mg by mouth daily with  breakfast.   montelukast 10 MG tablet Commonly known as:  SINGULAIR Take one tablet once daily as directed   ranitidine 150 MG tablet Commonly known as:  ZANTAC Take 150 mg by mouth daily.   rosuvastatin 10 MG tablet Commonly known as:  CRESTOR Take 1 tablet (10 mg total) by mouth daily.       Past Medical History:  Diagnosis Date  . Allergic rhinitis   . Diabetes mellitus without complication ([place])   . Eczema   . Elevated liver function tests   . Fibrocystic breast disease   . GERD (gastroesophageal reflux disease)   . Hypercholesteremia   . Hyperlipidemia   . Hypertension   . Microalbuminuria     Past Surgical History:  Procedure Laterality Date  . BREAST BIOPSY      Review of systems negative except as noted in HPI / PMHx or noted below:  Review of Systems  Constitutional: Negative.   HENT: Negative.   Eyes: Negative.   Respiratory: Negative.   Cardiovascular: Negative.   Gastrointestinal: Negative.   Genitourinary: Negative.   Musculoskeletal: Negative.   Skin: Negative.   Neurological: Negative.   Endo/Heme/Allergies: Negative.   Psychiatric/Behavioral: Negative.      Objective:   Vitals:   10/22/17 1601  BP: 130/64  Pulse: 80  Resp: 20  Physical Exam  HENT:  Head: Normocephalic.  Right Ear: Tympanic membrane, external ear and ear canal normal.  Left Ear: Tympanic membrane, external ear and ear canal normal.  Nose: Nose normal. No mucosal edema or rhinorrhea.  Mouth/Throat: Uvula is midline, oropharynx is clear and moist and mucous membranes are normal. No oropharyngeal exudate.  Eyes: Conjunctivae are normal.  Neck: Trachea normal. No tracheal tenderness present. No tracheal deviation present. No thyromegaly present.  Cardiovascular: Normal rate, regular rhythm, S1 normal, S2 normal and normal heart sounds.  No murmur heard. Pulmonary/Chest: Breath sounds normal. No stridor. No respiratory distress. She has no wheezes. She has  no rales.  Musculoskeletal: She exhibits no edema.  Lymphadenopathy:       Head (right side): No tonsillar adenopathy present.       Head (left side): No tonsillar adenopathy present.    She has no cervical adenopathy.  Neurological: She is alert.  Skin: Rash (Papular slightly erythematous excoriated dermatitis right elbow) noted. She is not diaphoretic. No erythema. Nails show no clubbing.    Diagnostics: none  Assessment and Plan:   1. Urticaria   2. Inflammatory dermatosis   3. Other allergic rhinitis   4. Gastroesophageal reflux disease, esophagitis presence not specified     1. Continue cetirizine 10 - 20 mg once a day: May add in Benadryl if needed  2. Continue montelukast 10 mg one tablet once a day  3. Continue Dymista one spray each nostril twice a day if needed  4. Continue Ranitidine 150 one tablet two times per day  5. Can use mometasone 0.1% ointment to eczema 2 times a day until resolved.  6. Return to clinic in 12 months or earlier if problem   Overall Kirsten Hodges has done very well with her therapy directed at urticaria and episodes of angioedema and has done well with her atopic upper airway disease while utilizing a combination of therapy in addition to having excellent control of her reflux on her current plan.  She appears to have an inflammatory dermatosis affecting her right elbow and I have given her topical mometasone to utilize for the next week or so.  Assuming she does well I will see her back in this clinic in 1 year or earlier if there is a problem.  Allena Katz, MD Allergy / Immunology [place]

## 2017-10-22 NOTE — Patient Instructions (Addendum)
  1. Continue cetirizine 10 - 20 mg once a day: May add in Benadryl if needed  2. Continue montelukast 10 mg one tablet once a day  3. Continue Dymista one spray each nostril twice a day if needed  4. Continue Ranitidine 150 one tablet two times per day  5. Can use mometasone 0.1% ointment to eczema 2 times a day until resolved.  6. Return to clinic in 12 months or earlier if problem

## 2017-10-26 ENCOUNTER — Encounter: Payer: Self-pay | Admitting: Allergy and Immunology

## 2018-02-26 ENCOUNTER — Other Ambulatory Visit: Payer: Self-pay | Admitting: *Deleted

## 2018-02-26 MED ORDER — MONTELUKAST SODIUM 10 MG PO TABS: ORAL_TABLET | ORAL | 5 refills | Status: DC

## 2018-04-28 ENCOUNTER — Other Ambulatory Visit: Payer: Self-pay | Admitting: Family Medicine

## 2018-04-28 ENCOUNTER — Other Ambulatory Visit (HOSPITAL_COMMUNITY)
Admission: RE | Admit: 2018-04-28 | Discharge: 2018-04-28 | Disposition: A | Discharge status: Home / Self Care | Payer: Managed Care, Other (non HMO) | Source: Ambulatory Visit | Attending: Family Medicine | Admitting: Family Medicine

## 2018-04-28 DIAGNOSIS — Z0000 Encounter for general adult medical examination without abnormal findings: Secondary | ICD-10-CM | POA: Insufficient documentation

## 2018-04-29 LAB — CYTOLOGY - PAP
Diagnosis: NEGATIVE
HPV (WINDOPATH): NOT DETECTED

## 2018-08-30 ENCOUNTER — Other Ambulatory Visit: Payer: Self-pay | Admitting: Allergy and Immunology

## 2018-10-28 ENCOUNTER — Other Ambulatory Visit: Payer: Self-pay | Admitting: Allergy and Immunology

## 2018-11-03 ENCOUNTER — Other Ambulatory Visit: Payer: Self-pay

## 2018-11-03 ENCOUNTER — Encounter: Payer: Self-pay | Admitting: Allergy and Immunology

## 2018-11-03 ENCOUNTER — Ambulatory Visit: Payer: Managed Care, Other (non HMO) | Admitting: Allergy and Immunology

## 2018-11-03 VITALS — BP 134/66 | HR 88 | Resp 18

## 2018-11-03 DIAGNOSIS — R945 Abnormal results of liver function studies: Secondary | ICD-10-CM | POA: Diagnosis not present

## 2018-11-03 DIAGNOSIS — K219 Gastro-esophageal reflux disease without esophagitis: Secondary | ICD-10-CM

## 2018-11-03 DIAGNOSIS — L509 Urticaria, unspecified: Secondary | ICD-10-CM

## 2018-11-03 DIAGNOSIS — J3089 Other allergic rhinitis: Secondary | ICD-10-CM | POA: Diagnosis not present

## 2018-11-03 DIAGNOSIS — R7989 Other specified abnormal findings of blood chemistry: Secondary | ICD-10-CM

## 2018-11-03 MED ORDER — MOMETASONE FUROATE 0.1 % EX OINT: TOPICAL_OINTMENT | CUTANEOUS | 2 refills | Status: DC

## 2018-11-03 MED ORDER — MONTELUKAST SODIUM 10 MG PO TABS: ORAL_TABLET | ORAL | 3 refills | Status: DC

## 2018-11-03 NOTE — Patient Instructions (Addendum)
  1. Continue cetirizine or loratadine 10 - 20 mg once a day   2. Continue montelukast 10 mg one tablet once a day  3. Start Nasacort one spray each nostril once a day  4. Can use mometasone 0.1% ointment to eczema 2 times a day until resolved.  5. Blood -alpha 1 antitrypsin level and phenotype  6. Return to clinic in 12 months or earlier if problem

## 2018-11-03 NOTE — Progress Notes (Signed)
[place] [facility] [place] [facility] [place]   Follow-up Note  Referring Provider: Donald Prose, MD Primary Provider: Donald Prose, MD Date of Office Visit: 11/03/2018  Subjective:   Kirsten Hodges (DOB: 1961/02/08) is a 58 y.o. female who returns to the [facility] on 11/03/2018 in re-evaluation of the following:  HPI: Kirsten Hodges presents to this clinic in evaluation of her chronic urticaria and allergic rhinoconjunctivitis and reflux disease and a history of elevated liver function tests with a positive ANA.  Her last visit to this clinic was 22 October 2017.  For the most part Kirsten Hodges's urticaria has resolved and she does not require any specific therapy for this issue at this point in time and she has not had any associated angioedema.  She continues to eat mammal with no problem at all.  Since the spring has arrived she is had a little bit more problems with sneezing and some nasal congestion.  She is tried Claritin which has not been working very well.  She no longer uses a nasal steroid.  She does continue to use montelukast on a regular basis.  She has some intermittent inflammatory dermatosis affecting her upper arms for which she will occasionally use mometasone which works quite well after a day or 2 of use.  She still has elevated liver function tests.  She had blood tests in October 2019 which identified elevations in both AST and ALT.  She does not have any issues with reflux at this point.  Allergies as of 11/03/2018      Reactions   Enalapril    cough   Lipitor [atorvastatin]    Muscle aches   Welchol [colesevelam Hcl]    Worsened LFTs   Zocor [simvastatin]       Medication List      aspirin EC 81 MG tablet Take 81 mg by mouth daily.   ezetimibe 10 MG tablet Commonly known as:  Zetia Take 1 tablet (10 mg total) by mouth daily.   Janumet 50-1000 MG tablet Generic drug:  sitaGLIPtin-metformin Take 1 tablet by mouth 2 (two)  times daily with a meal.   losartan 25 MG tablet Commonly known as:  COZAAR Take 25 mg by mouth daily.   METAMUCIL FIBER PO Take by mouth.   mometasone 0.1 % ointment Commonly known as:  ELOCON Can apply to eczema twice daily until resolved.   montelukast 10 MG tablet Commonly known as:  SINGULAIR Take one tablet once daily as directed   MULTIVITAMIN ADULTS 50+ PO Take by mouth.   rosuvastatin 10 MG tablet Commonly known as:  Crestor Take 1 tablet (10 mg total) by mouth daily.       Past Medical History:  Diagnosis Date  . Allergic rhinitis   . Diabetes mellitus without complication ([place])   . Eczema   . Elevated liver function tests   . Fibrocystic breast disease   . GERD (gastroesophageal reflux disease)   . Hypercholesteremia   . Hyperlipidemia   . Hypertension   . Microalbuminuria     Past Surgical History:  Procedure Laterality Date  . BREAST BIOPSY      Review of systems negative except as noted in HPI / PMHx or noted below:  Review of Systems  Constitutional: Negative.   HENT: Negative.   Eyes: Negative.   Respiratory: Negative.   Cardiovascular: Negative.   Gastrointestinal: Negative.   Genitourinary: Negative.   Musculoskeletal: Negative.   Skin: Negative.  Neurological: Negative.   Endo/Heme/Allergies: Negative.   Psychiatric/Behavioral: Negative.      Objective:   Vitals:   11/03/18 1607  BP: 134/66  Pulse: 88  Resp: 18          Physical Exam Constitutional:      Appearance: She is not diaphoretic.  HENT:     Head: Normocephalic.     Right Ear: Tympanic membrane, ear canal and external ear normal.     Left Ear: Tympanic membrane, ear canal and external ear normal.     Nose: Nose normal. No mucosal edema or rhinorrhea.     Mouth/Throat:     Pharynx: Uvula midline. No oropharyngeal exudate.  Eyes:     Conjunctiva/sclera: Conjunctivae normal.  Neck:     Thyroid: No thyromegaly.     Trachea: Trachea normal. No tracheal  tenderness or tracheal deviation.  Cardiovascular:     Rate and Rhythm: Normal rate and regular rhythm.     Heart sounds: Normal heart sounds, S1 normal and S2 normal. No murmur.  Pulmonary:     Effort: No respiratory distress.     Breath sounds: Normal breath sounds. No stridor. No wheezing or rales.  Lymphadenopathy:     Head:     Right side of head: No tonsillar adenopathy.     Left side of head: No tonsillar adenopathy.     Cervical: No cervical adenopathy.  Skin:    Findings: No erythema or rash.     Nails: There is no clubbing.   Neurological:     Mental Status: She is alert.     Diagnostics: none  Assessment and Plan:   1. Urticaria   2. Other allergic rhinitis   3. Gastroesophageal reflux disease, esophagitis presence not specified   4. Elevated liver function tests     1. Continue cetirizine or loratadine 10 - 20 mg once a day   2. Continue montelukast 10 mg one tablet once a day  3. Start Nasacort one spray each nostril once a day  4. Can use mometasone 0.1% ointment to eczema 2 times a day until resolved.  5. Blood -alpha 1 antitrypsin level and phenotype  6. Return to clinic in 12 months or earlier if problem   Kirsten Hodges appears to be doing much better regarding the issue with her overactive immune system and urticaria and episodes of angioedema.  This appears to be completely inactive at this point in time.  Certainly she has developing some problems with her airway since the spring has arrived and she can start Nasacort while she continues to use a leukotriene modifier and if needed antihistamines.  She has intermittent inflammatory dermatosis involving her upper arms and she is certainly welcome to continue to use mometasone as needed.  She has had extensive evaluation for her elevated liver function tests in the past but one test that is missing is an alpha-1 antitrypsin level and phenotype and we will get that performed this week.  I will see her back in this  clinic in 1 year or earlier if there is a problem.  Kirsten Katz, MD Allergy / Immunology [place]

## 2018-11-04 ENCOUNTER — Encounter: Payer: Self-pay | Admitting: Allergy and Immunology

## 2018-11-09 LAB — ALPHA-1 ANTITRYPSIN PHENOTYPE: A-1 Antitrypsin: 126 mg/dL (ref 101–187)

## 2019-01-05 ENCOUNTER — Other Ambulatory Visit: Payer: Self-pay | Admitting: Endocrinology

## 2019-01-05 DIAGNOSIS — E049 Nontoxic goiter, unspecified: Secondary | ICD-10-CM

## 2019-01-07 ENCOUNTER — Telehealth: Payer: Self-pay | Admitting: Allergy and Immunology

## 2019-01-07 NOTE — Telephone Encounter (Signed)
Kirsten Hodges called in and stated she received 2 sample bottles of NASONEX from Dr. Neldon Mc the last time she was seen.  She states she finished both bottles and even bought one from the store and it is still not helping with her nasal drainage.  Kirsten Hodges was wondering if there is another nose spray she can try or if there is anything else she can do.  Dr. Neldon Mc is out of the office until Monday and she would like some relief sooner.  Kirsten Hodges would like something called in to [facility] in [place].   Please advise.

## 2019-01-08 MED ORDER — AZELASTINE HCL 0.1 % NA SOLN: 2.0000 | Freq: Two times a day (BID) | NASAL | 5 refills | Status: DC

## 2019-01-08 NOTE — Telephone Encounter (Signed)
straight to voicemail and mailbox full. I was unable to leave message. I will order azelastine 2 sprays twice a day

## 2019-01-08 NOTE — Telephone Encounter (Signed)
I called the number listed and it went straight to voicemail and the mailbox is full. I will order azelastine 2 sprays twice a day and try to call her again.

## 2019-01-10 ENCOUNTER — Telehealth: Payer: Self-pay | Admitting: Family Medicine

## 2019-01-12 NOTE — Telephone Encounter (Signed)
LMOM

## 2019-01-28 ENCOUNTER — Ambulatory Visit
Admission: RE | Admit: 2019-01-28 | Discharge: 2019-01-28 | Disposition: A | Discharge status: Home / Self Care | Payer: Managed Care, Other (non HMO) | Source: Ambulatory Visit | Attending: Endocrinology | Admitting: Endocrinology

## 2019-01-28 DIAGNOSIS — E049 Nontoxic goiter, unspecified: Secondary | ICD-10-CM

## 2019-05-31 ENCOUNTER — Other Ambulatory Visit: Payer: Self-pay

## 2019-05-31 ENCOUNTER — Telehealth: Payer: Self-pay | Admitting: Allergy and Immunology

## 2019-05-31 NOTE — Telephone Encounter (Signed)
PT called to get zyrtec refill sent to [facility] pharmacy.

## 2019-05-31 NOTE — Telephone Encounter (Signed)
Informed patient that this medication was OTC and that her insurance may not pay for it. She stated she would just pick it up since its otc.

## 2019-08-06 ENCOUNTER — Other Ambulatory Visit: Payer: Self-pay | Admitting: Otolaryngology

## 2019-08-06 DIAGNOSIS — K118 Other diseases of salivary glands: Secondary | ICD-10-CM

## 2019-08-18 ENCOUNTER — Other Ambulatory Visit: Payer: Self-pay

## 2019-08-18 ENCOUNTER — Ambulatory Visit
Admission: RE | Admit: 2019-08-18 | Discharge: 2019-08-18 | Disposition: A | Discharge status: Home / Self Care | Payer: Managed Care, Other (non HMO) | Source: Ambulatory Visit | Attending: Otolaryngology | Admitting: Otolaryngology

## 2019-08-18 DIAGNOSIS — K118 Other diseases of salivary glands: Secondary | ICD-10-CM

## 2019-08-18 MED ORDER — IOPAMIDOL (ISOVUE-300) INJECTION 61%
75.0000 mL | Freq: Once | INTRAVENOUS | Status: AC | PRN
  Administered 2019-08-18: 11:00:00 75 mL via INTRAVENOUS

## 2019-08-23 ENCOUNTER — Other Ambulatory Visit: Payer: Self-pay | Admitting: Allergy and Immunology

## 2019-09-13 ENCOUNTER — Encounter (HOSPITAL_COMMUNITY): Payer: Self-pay | Admitting: Emergency Medicine

## 2019-09-13 ENCOUNTER — Emergency Department (HOSPITAL_COMMUNITY)
Admission: EM | Admit: 2019-09-13 | Discharge: 2019-09-13 | Disposition: A | Discharge status: Home / Self Care | Payer: Managed Care, Other (non HMO) | Attending: Emergency Medicine | Admitting: Emergency Medicine

## 2019-09-13 ENCOUNTER — Emergency Department (HOSPITAL_COMMUNITY): Payer: Managed Care, Other (non HMO)

## 2019-09-13 ENCOUNTER — Other Ambulatory Visit: Payer: Self-pay

## 2019-09-13 DIAGNOSIS — R220 Localized swelling, mass and lump, head: Secondary | ICD-10-CM | POA: Diagnosis present

## 2019-09-13 DIAGNOSIS — K1121 Acute sialoadenitis: Secondary | ICD-10-CM

## 2019-09-13 DIAGNOSIS — Z7982 Long term (current) use of aspirin: Secondary | ICD-10-CM | POA: Diagnosis not present

## 2019-09-13 DIAGNOSIS — Z79899 Other long term (current) drug therapy: Secondary | ICD-10-CM | POA: Diagnosis not present

## 2019-09-13 DIAGNOSIS — E119 Type 2 diabetes mellitus without complications: Secondary | ICD-10-CM | POA: Diagnosis not present

## 2019-09-13 DIAGNOSIS — I10 Essential (primary) hypertension: Secondary | ICD-10-CM | POA: Insufficient documentation

## 2019-09-13 LAB — CBC WITH DIFFERENTIAL/PLATELET
Abs Immature Granulocytes: 0.04 10*3/uL (ref 0.00–0.07)
Basophils Absolute: 0.1 10*3/uL (ref 0.0–0.1)
Basophils Relative: 0 %
Eosinophils Absolute: 0.1 10*3/uL (ref 0.0–0.5)
Eosinophils Relative: 1 %
HCT: 38.5 % (ref 36.0–46.0)
Hemoglobin: 12.7 g/dL (ref 12.0–15.0)
Immature Granulocytes: 0 %
Lymphocytes Relative: 22 %
Lymphs Abs: 3.2 10*3/uL (ref 0.7–4.0)
MCH: 30.2 pg (ref 26.0–34.0)
MCHC: 33 g/dL (ref 30.0–36.0)
MCV: 91.7 fL (ref 80.0–100.0)
Monocytes Absolute: 1.1 10*3/uL — ABNORMAL HIGH (ref 0.1–1.0)
Monocytes Relative: 8 %
Neutro Abs: 10.1 10*3/uL — ABNORMAL HIGH (ref 1.7–7.7)
Neutrophils Relative %: 69 %
Platelets: 222 10*3/uL (ref 150–400)
RBC: 4.2 MIL/uL (ref 3.87–5.11)
RDW: 12.4 % (ref 11.5–15.5)
WBC: 14.6 10*3/uL — ABNORMAL HIGH (ref 4.0–10.5)
nRBC: 0 % (ref 0.0–0.2)

## 2019-09-13 LAB — BASIC METABOLIC PANEL
Anion gap: 8 (ref 5–15)
BUN: 11 mg/dL (ref 6–20)
CO2: 26 mmol/L (ref 22–32)
Calcium: 9.2 mg/dL (ref 8.9–10.3)
Chloride: 103 mmol/L (ref 98–111)
Creatinine, Ser: 0.62 mg/dL (ref 0.44–1.00)
GFR calc Af Amer: >60 mL/min (ref >60)
GFR calc non Af Amer: >60 mL/min (ref >60)
Glucose, Bld: 216 mg/dL — ABNORMAL HIGH (ref 70–99)
Potassium: 3.7 mmol/L (ref 3.5–5.1)
Sodium: 137 mmol/L (ref 135–145)

## 2019-09-13 MED ORDER — HYDROCODONE-ACETAMINOPHEN 5-325 MG PO TABS: 1.0000 | ORAL_TABLET | Freq: Four times a day (QID) | ORAL | 0 refills | Status: DC | PRN

## 2019-09-13 MED ORDER — IOHEXOL 300 MG/ML  SOLN
75.0000 mL | Freq: Once | INTRAMUSCULAR | Status: AC | PRN
  Administered 2019-09-13: 75 mL via INTRAVENOUS

## 2019-09-13 MED ORDER — SODIUM CHLORIDE 0.9 % IV BOLUS (SEPSIS)
1000.0000 mL | Freq: Once | INTRAVENOUS | Status: AC
  Administered 2019-09-13: 1000 mL via INTRAVENOUS

## 2019-09-13 MED ORDER — SODIUM CHLORIDE 0.9 % IV SOLN
3.0000 g | Freq: Once | INTRAVENOUS | Status: AC
  Administered 2019-09-13: 3 g via INTRAVENOUS
  Filled 2019-09-13: qty 8

## 2019-09-13 MED ORDER — FENTANYL CITRATE (PF) 100 MCG/2ML IJ SOLN
100.0000 ug | Freq: Once | INTRAMUSCULAR | Status: AC
  Administered 2019-09-13: 100 ug via INTRAVENOUS
  Filled 2019-09-13: qty 2

## 2019-09-13 MED ORDER — AMOXICILLIN-POT CLAVULANATE 875-125 MG PO TABS: 1.0000 | ORAL_TABLET | Freq: Two times a day (BID) | ORAL | 0 refills | Status: DC

## 2019-09-13 MED ORDER — SODIUM CHLORIDE (PF) 0.9 % IJ SOLN
INTRAMUSCULAR | Status: AC
  Filled 2019-09-13: qty 50

## 2019-09-13 MED ORDER — HYDROCODONE-ACETAMINOPHEN 5-325 MG PO TABS
1.0000 | ORAL_TABLET | Freq: Once | ORAL | Status: AC
  Administered 2019-09-13: 1 via ORAL
  Filled 2019-09-13: qty 1

## 2019-09-13 NOTE — ED Triage Notes (Signed)
Patient here from home with complaints of right side facial pain and swelling near ear that started today, increased after eating dinner. Denies allergies, denies tooth pain.

## 2019-09-13 NOTE — ED Provider Notes (Signed)
[facility] DEPT Provider Note   CSN: [id number] Arrival date & time: 09/13/19  0004     History Chief Complaint  Patient presents with  . Facial Pain  . Facial Swelling    Kirsten Hodges Kirsten Hodges is a 59 y.o. female.  HPI    Patient history diabetes, hypertension, hyperlipidemia presents with right facial swelling.  She reports over the past several hours the right side of her face and upper neck are swelling.  She reports it is painful.  No fevers or vomiting.  She reports difficulty swallowing and painful swallowing.  No chest pain shortness of breath.  She reports mild headache.  Denies any allergic symptoms. Nothing improves her symptoms.  Palpation worsens her symptoms  She reports she has had this previously has been seen by otolaryngology Dr. Wilburn Cornelia for this issue.  She received a CT scan last month Past Medical History:  Diagnosis Date  . Allergic rhinitis   . Diabetes mellitus without complication ([place])   . Eczema   . Elevated liver function tests   . Fibrocystic breast disease   . GERD (gastroesophageal reflux disease)   . Hypercholesteremia   . Hyperlipidemia   . Hypertension   . Microalbuminuria     Patient Active Problem List   Diagnosis Date Noted  . Diabetes mellitus without complication ([place])   . Hypertension   . Microalbuminuria   . Hypercholesteremia   . Fibrocystic breast disease   . Hyperlipidemia 07/05/2013    Past Surgical History:  Procedure Laterality Date  . BREAST BIOPSY       OB History   No obstetric history on file.     Family History  Problem Relation Age of Onset  . Hyperlipidemia Father   . Cancer Father   . Hypertension Mother   . Hyperlipidemia Brother   . Stroke Brother     Social History   Tobacco Use  . Smoking status: Never Smoker  . Smokeless tobacco: Never Used  Substance Use Topics  . Alcohol use: Yes  . Drug use: No    Home Medications Prior to Admission  medications   Medication Sig Start Date End Date Taking? Authorizing Provider  aspirin EC 81 MG tablet Take 81 mg by mouth daily.    [provider]  azelastine (ASTELIN) 0.1 % nasal spray Place 2 sprays into both nostrils 2 (two) times daily. 01/08/19   Dara Hoyer, FNP  ezetimibe (ZETIA) 10 MG tablet Take 1 tablet (10 mg total) by mouth daily. 10/03/13   Jerline Pain, MD  JANUMET 50-1000 MG tablet Take 1 tablet by mouth 2 (two) times daily with a meal. 10/28/18   [provider]  loratadine (CLARITIN) 10 MG tablet Take 10 mg by mouth daily.    [provider]  losartan (COZAAR) 25 MG tablet Take 25 mg by mouth daily.    [provider]  mometasone (ELOCON) 0.1 % ointment Can apply to eczema twice daily until resolved. 11/03/18   Kozlow, Donnamarie Poag, MD  montelukast (SINGULAIR) 10 MG tablet Take one tablet by mouth once daily. 08/23/19   Kozlow, Donnamarie Poag, MD  Multiple Vitamins-Minerals (MULTIVITAMIN ADULTS 50+ PO) Take by mouth.    [provider]  Psyllium (METAMUCIL FIBER PO) Take by mouth.    [provider]  rosuvastatin (CRESTOR) 10 MG tablet Take 1 tablet (10 mg total) by mouth daily. 10/03/13   Jerline Pain, MD    Allergies  Enalapril, Lipitor [atorvastatin], Welchol [colesevelam hcl], and Zocor [simvastatin]  Review of Systems   Review of Systems  Constitutional: Negative for fever.  HENT: Positive for facial swelling and trouble swallowing.   Respiratory: Negative for shortness of breath.   Cardiovascular: Negative for chest pain.  Gastrointestinal: Negative for vomiting.  Skin: Negative for rash.  All other systems reviewed and are negative.   Physical Exam Updated Vital Signs BP (!) 180/77 (BP Location: Right Arm)   Pulse (!) 110   Temp 98.9 F (37.2 C) (Oral)   Resp 19   SpO2 100%   Physical Exam CONSTITUTIONAL: Well developed/well nourished HEAD: Normocephalic/atraumatic EYES: EOMI/PERRL ENMT: Mucous membranes  moist, no dental abscess, no trismus, large swelling in the right facial region with localized tenderness.  No erythema or abscess. NECK: supple no meningeal signs, tenderness and swelling to the right submandibular region CV: S1/S2 noted, no murmurs/rubs/gallops noted LUNGS: Lungs are clear to auscultation bilaterally, no apparent distress ABDOMEN: soft, nontender NEURO: Pt is awake/alert/appropriate, moves all extremitiesx4.  No facial droop.   EXTREMITIES: pulses normal/equal, full ROM SKIN: warm, color normal PSYCH: no abnormalities of mood noted, alert and oriented to situation      ED Results / Procedures / Treatments   Labs (all labs ordered are listed, but only abnormal results are displayed) Labs Reviewed  BASIC METABOLIC PANEL - Abnormal; Notable for the following components:      Result Value   Glucose, Bld 216 (*)    All other components within normal limits  CBC WITH DIFFERENTIAL/PLATELET - Abnormal; Notable for the following components:   WBC 14.6 (*)    Neutro Abs 10.1 (*)    Monocytes Absolute 1.1 (*)    All other components within normal limits    EKG None  Radiology CT Soft Tissue Neck W Contrast  Result Date: 09/13/2019 CLINICAL DATA:  Initial evaluation for acute right facial pain and swelling for 1 day. EXAM: CT NECK WITH CONTRAST TECHNIQUE: Multidetector CT imaging of the neck was performed using the standard protocol following the bolus administration of intravenous contrast. CONTRAST:  26mL OMNIPAQUE IOHEXOL 300 MG/ML  SOLN COMPARISON:  Prior CT from 08/18/2019. FINDINGS: Pharynx and larynx: Oral cavity within normal limits without discrete mass or collection. No acute abnormality about the remaining dentition. Palatine tonsils symmetric and normal. Remainder of the oropharynx and nasopharynx within normal limits. No retropharyngeal collection or swelling. Epiglottis normal. Vallecula clear. Remainder of the hypopharynx and supraglottic larynx within normal  limits. True cords symmetric and normal. Subglottic airway clear. Salivary glands: Right parotid gland asymmetrically enlarged with heterogeneous hyperenhancement, consistent with acute parotitis. No obstructive stone seen within Stensen's duct. No loculated collection or abscess. Associated swelling with inflammatory stranding seen within the subcutaneous fat of the adjacent right parotid and parapharyngeal spaces. Extension into the right submandibular region to partially surround the right submandibular gland. Remainder of the salivary glands otherwise unremarkable. Thyroid: Multinodular thyroid with dominant nodule measuring up to 18 mm on the right. These abdomen previously evaluated by ultrasound, most recent of which at 01/28/2019. These do not warrant further follow-up as per previous recommendation. Lymph nodes: Mildly prominent 9 mm right level II a node, likely reactive. No other pathologically enlarged or abnormal adenopathy within the neck. Vascular: Normal intravascular enhancement seen throughout the neck. Mild atherosclerotic change noted. Limited intracranial: Unremarkable. Visualized orbits: Visualized globes and orbital soft tissues within normal limits. Mastoids and visualized paranasal sinuses: Mild scattered mucosal thickening noted within the ethmoidal air cells  and maxillary sinuses. Paranasal sinuses are otherwise clear. Mastoids and middle ear cavities are well pneumatized and free of fluid. Skeleton: No acute osseous abnormality. No discrete or worrisome osseous lesions. Upper chest: Visualized upper chest demonstrates no acute finding. Other: None. IMPRESSION: 1. Findings consistent with acute right-sided parotitis. No obstructive stone or other complication identified. 2. Multinodular thyroid, previously identified with multiple thyroid ultrasounds. No further ultrasound surveillance deemed necessary on most recent ultrasound from 01/28/2019. Electronically Signed   By: Jeannine Boga M.D.   On: 09/13/2019 02:25    Procedures Procedures    Medications Ordered in ED Medications  sodium chloride (PF) 0.9 % injection (  Not Given 09/13/19 0129)  fentaNYL (SUBLIMAZE) injection 100 mcg (100 mcg Intravenous Given 09/13/19 0100)  iohexol (OMNIPAQUE) 300 MG/ML solution 75 mL (75 mLs Intravenous Contrast Given 09/13/19 0133)  Ampicillin-Sulbactam (UNASYN) 3 g in sodium chloride 0.9 % 100 mL IVPB (3 g Intravenous New Bag/Given 09/13/19 0311)  sodium chloride 0.9 % bolus 1,000 mL (1,000 mLs Intravenous New Bag/Given 09/13/19 0309)  HYDROcodone-acetaminophen (NORCO/VICODIN) 5-325 MG per tablet 1 tablet (1 tablet Oral Given 09/13/19 0341)    ED Course  I have reviewed the triage vital signs and the nursing notes.  Pertinent labs & imaging results that were available during my care of the patient were reviewed by me and considered in my medical decision making (see chart for details).    MDM Rules/Calculators/A&P                      1:10 AM Patient with sudden worsening onset of right-sided facial swelling.  Suspect parotid gland involvement, but CT scan last month revealed normal gland Due to rapidly worsening symptoms, emergency CT scan of the neck will be ordered 4:00 AM CT imaging reveals acute parotitis without complicating features. Patient is in no acute distress.  No change in the size of the swelling. Patient is appropriate for discharge home.  She will be given a p.o. challenge.  She will call Dr. Wilburn Cornelia later today for close follow-up.  Antibiotics have been started. Final Clinical Impression(s) / ED Diagnoses Final diagnoses:  Acute parotitis    Rx / DC Orders ED Discharge Orders         Ordered    HYDROcodone-acetaminophen (NORCO/VICODIN) 5-325 MG tablet  Every 6 hours PRN     09/13/19 0357    amoxicillin-clavulanate (AUGMENTIN) 875-125 MG tablet  2 times daily     09/13/19 0357           Ripley Fraise, MD 09/13/19 0401

## 2019-09-15 NOTE — ED Notes (Signed)
Opened chart at pts request to print work note.

## 2019-11-03 ENCOUNTER — Ambulatory Visit: Payer: Managed Care, Other (non HMO) | Admitting: Allergy and Immunology

## 2019-11-11 ENCOUNTER — Other Ambulatory Visit: Payer: Self-pay | Admitting: Family Medicine

## 2019-11-21 ENCOUNTER — Ambulatory Visit: Payer: Managed Care, Other (non HMO) | Admitting: Allergy and Immunology

## 2020-01-03 ENCOUNTER — Encounter: Payer: Self-pay | Admitting: Allergy and Immunology

## 2020-01-03 ENCOUNTER — Ambulatory Visit: Payer: Managed Care, Other (non HMO) | Admitting: Allergy and Immunology

## 2020-01-03 ENCOUNTER — Other Ambulatory Visit: Payer: Self-pay

## 2020-01-03 VITALS — BP 122/78 | HR 78 | Temp 97.5°F | Resp 17 | Ht 60.1 in | Wt 144.2 lb

## 2020-01-03 DIAGNOSIS — J301 Allergic rhinitis due to pollen: Secondary | ICD-10-CM

## 2020-01-03 DIAGNOSIS — L509 Urticaria, unspecified: Secondary | ICD-10-CM

## 2020-01-03 DIAGNOSIS — L308 Other specified dermatitis: Secondary | ICD-10-CM

## 2020-01-03 DIAGNOSIS — R7989 Other specified abnormal findings of blood chemistry: Secondary | ICD-10-CM

## 2020-01-03 DIAGNOSIS — L989 Disorder of the skin and subcutaneous tissue, unspecified: Secondary | ICD-10-CM

## 2020-01-03 DIAGNOSIS — J3089 Other allergic rhinitis: Secondary | ICD-10-CM

## 2020-01-03 MED ORDER — AZELASTINE-FLUTICASONE 137-50 MCG/ACT NA SUSP: NASAL | 5 refills | Status: DC

## 2020-01-03 NOTE — Patient Instructions (Addendum)
  1. Continue cetirizine 10 mg 1 tablet 1 time per day   2. Continue montelukast 10 mg 1 tablet 1 time per day  3. START Dymista - 1 spray each nostril 2 times per day  4. Can use mometasone 0.1% ointment to eczema 2 times a day until resolved  5. Have primary doctor follow liver function tests  6. Return to clinic in 12 months or earlier if problem

## 2020-01-03 NOTE — Progress Notes (Signed)
[place] [facility] [place] [facility] [place]   Follow-up Note  Referring Provider: Donald Prose, MD Primary Provider: Donald Prose, MD Date of Office Visit: 01/03/2020  Subjective:   Kirsten Hodges [facility] (DOB: 1961/02/12) is a 59 y.o. female who returns to the [facility] [place] on 01/03/2020 in re-evaluation of the following:  HPI: Kirsten Hodges returns to this clinic in reevaluation of chronic urticaria and inflammatory dermatosis and allergic rhinoconjunctivitis and history of reflux and elevated liver function test with a positive ANA.  Her last visit to this clinic was 03 November 2018.  She has not had any new urticaria. Inflammatory dermatosis that occasionally affects her upper arms is very intermittent changed and she rarely uses any topical mometasone.  She still continues to have issues with nasal congestion and sneezing and rhinorrhea even though she has been using nasal azelastine and montelukast and cetirizine on a daily basis.  She is having her liver function test abnormalities followed by her primary care doctor and apparently she still has elevations in these tests.  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 Wilburn Cornelia, ENT, with an imaging study which did not identify any significant abnormality.   Allergies as of 01/03/2020      Reactions   Enalapril    cough   Lipitor [atorvastatin]    Muscle aches   Welchol [colesevelam Hcl]    Worsened LFTs   Zocor [simvastatin]       Medication List      aspirin EC 81 MG tablet Take 81 mg by mouth daily.   azelastine 0.1 % nasal spray Commonly known as: ASTELIN USE 2 SPRAYS IN EACH NOSTRIL TWICE DAILY   ezetimibe 10 MG tablet Commonly known as: Zetia Take 1 tablet (10 mg total) by mouth daily.   HYDROcodone-acetaminophen 5-325 MG tablet Commonly known as: NORCO/VICODIN Take 1 tablet by mouth every 6 (six) hours as needed for severe pain.     Janumet 50-1000 MG tablet Generic drug: sitaGLIPtin-metformin Take 1 tablet by mouth 2 (two) times daily with a meal.   loratadine 10 MG tablet Commonly known as: CLARITIN Take 10 mg by mouth daily.   losartan 25 MG tablet Commonly known as: COZAAR Take 25 mg by mouth daily.   montelukast 10 MG tablet Commonly known as: SINGULAIR Take one tablet by mouth once daily.   rosuvastatin 10 MG tablet Commonly known as: Crestor Take 1 tablet (10 mg total) by mouth daily.       Past Medical History:  Diagnosis Date  . Allergic rhinitis   . Diabetes mellitus without complication ([place])   . Eczema   . Elevated liver function tests   . Fibrocystic breast disease   . GERD (gastroesophageal reflux disease)   . Hypercholesteremia   . Hyperlipidemia   . Hypertension   . Microalbuminuria     Past Surgical History:  Procedure Laterality Date  . BREAST BIOPSY      Review of systems negative except as noted in HPI / PMHx or noted below:  Review of Systems  Constitutional: Negative.   HENT: Negative.   Eyes: Negative.   Respiratory: Negative.   Cardiovascular: Negative.   Gastrointestinal: Negative.   Genitourinary: Negative.   Musculoskeletal: Negative.   Skin: Negative.   Neurological: Negative.   Endo/Heme/Allergies: Negative.   Psychiatric/Behavioral: Negative.      Objective:   Vitals:   01/03/20 1129  BP: 122/78  Pulse: 78  Resp: 17  Temp: (!) 97.5 F (36.4 C)  SpO2: 98%   Height: 5' 0.1" (152.7 cm)  Weight: 144 lb 3.2 oz (65.4 kg)   Physical Exam Constitutional:      Appearance: She is not diaphoretic.  HENT:     Head: Normocephalic.     Right Ear: Tympanic membrane, ear canal and external ear normal.     Left Ear: Tympanic membrane, ear canal and external ear normal.     Nose: Nose normal. No mucosal edema or rhinorrhea.     Mouth/Throat:     Pharynx: Uvula midline. No oropharyngeal exudate.  Eyes:     Conjunctiva/sclera: Conjunctivae normal.   Neck:     Thyroid: No thyromegaly.     Trachea: Trachea normal. No tracheal tenderness or tracheal deviation.  Cardiovascular:     Rate and Rhythm: Normal rate and regular rhythm.     Heart sounds: Normal heart sounds, S1 normal and S2 normal. No murmur heard.   Pulmonary:     Effort: No respiratory distress.     Breath sounds: Normal breath sounds. No stridor. No wheezing or rales.  Lymphadenopathy:     Head:     Right side of head: No tonsillar adenopathy.     Left side of head: No tonsillar adenopathy.     Cervical: No cervical adenopathy.  Skin:    Findings: No erythema or rash.     Nails: There is no clubbing.  Neurological:     Mental Status: She is alert.     Diagnostics: none  Assessment and Plan:   1. Urticaria   2. Perennial allergic rhinitis   3. Inflammatory dermatosis   4. Seasonal allergic rhinitis due to pollen   5. Elevated liver function tests     1. Continue cetirizine 10 mg 1 tablet 1 time per day   2. Continue montelukast 10 mg 1 tablet 1 time per day  3. START Dymista - 1 spray each nostril 2 times per day  4. Can use mometasone 0.1% ointment to eczema 2 times a day until resolved  5. Have primary doctor follow liver function tests  6. Return to clinic in 12 months or earlier if problem   Kirsten Hodges still is somewhat symptomatic regarding her atopic respiratory disease and we will give her a mixture of a nasal antihistamine and nasal steroid to be utilized on a consistent basis to see if this takes care of this issue while she also continues on cetirizine and montelukast. If she fails medical therapy she be a candidate for immunotherapy. She can continue to use intermittent mometasone topically. She can continue to have follow-up with her primary care doctor regarding her elevated liver function tests.  She may require a complete ultrasound of her abdomen and a liver biopsy if she still continues to have elevated liver function test.  I will see her back  in his clinic in 1 year or earlier if there is a problem.  Allena Katz, MD Allergy / Immunology [place]

## 2020-01-04 ENCOUNTER — Encounter: Payer: Self-pay | Admitting: Allergy and Immunology

## 2020-02-17 ENCOUNTER — Other Ambulatory Visit: Payer: Self-pay | Admitting: Allergy and Immunology

## 2020-05-02 ENCOUNTER — Other Ambulatory Visit: Payer: Self-pay | Admitting: Radiology

## 2020-05-02 ENCOUNTER — Other Ambulatory Visit: Payer: Self-pay | Admitting: Family Medicine

## 2020-05-02 ENCOUNTER — Ambulatory Visit
Admission: RE | Admit: 2020-05-02 | Discharge: 2020-05-02 | Disposition: A | Discharge status: Home / Self Care | Payer: Managed Care, Other (non HMO) | Source: Ambulatory Visit | Attending: Family Medicine | Admitting: Family Medicine

## 2020-05-02 DIAGNOSIS — M25511 Pain in right shoulder: Secondary | ICD-10-CM

## 2020-05-17 DIAGNOSIS — U071 COVID-19: Secondary | ICD-10-CM

## 2020-05-17 HISTORY — DX: COVID-19: U07.1

## 2020-05-21 ENCOUNTER — Other Ambulatory Visit (HOSPITAL_COMMUNITY): Payer: Self-pay | Admitting: Family

## 2020-05-21 ENCOUNTER — Telehealth: Payer: Self-pay | Admitting: Physician Assistant

## 2020-05-21 DIAGNOSIS — U071 COVID-19: Secondary | ICD-10-CM

## 2020-05-21 NOTE — Progress Notes (Signed)
I connected by phone with Kirsten Hodges [facility] on 05/21/2020 at 5:31 PM to discuss the potential use of a new treatment for mild to moderate COVID-19 viral infection in non-hospitalized patients.  This patient is a 59 y.o. female that meets the FDA criteria for Emergency Use Authorization of COVID monoclonal antibody casirivimab/imdevimab, bamlanivimab/eteseviamb, or sotrovimab.  Has a (+) direct SARS-CoV-2 viral test result  Has mild or moderate COVID-19   Is NOT hospitalized due to COVID-19  Is within 10 days of symptom onset  Has at least one of the high risk factor(s) for progression to severe COVID-19 and/or hospitalization as defined in EUA.  Specific high risk criteria : Diabetes and Cardiovascular disease or hypertension   Symptoms of H/A, sneezing, and aches began 05/17/20.   I have spoken and communicated the following to the patient or parent/caregiver regarding COVID monoclonal antibody treatment:  1. FDA has authorized the emergency use for the treatment of mild to moderate COVID-19 in adults and pediatric patients with positive results of direct SARS-CoV-2 viral testing who are 60 years of age and older weighing at least 40 kg, and who are at high risk for progressing to severe COVID-19 and/or hospitalization.  2. The significant known and potential risks and benefits of COVID monoclonal antibody, and the extent to which such potential risks and benefits are unknown.  3. Information on available alternative treatments and the risks and benefits of those alternatives, including clinical trials.  4. Patients treated with COVID monoclonal antibody should continue to self-isolate and use infection control measures (e.g., wear mask, isolate, social distance, avoid sharing personal items, clean and disinfect "high touch" surfaces, and frequent handwashing) according to CDC guidelines.   5. The patient or parent/caregiver has the option to accept or refuse COVID monoclonal  antibody treatment.  After reviewing this information with the patient, the patient has agreed to receive one of the available covid 19 monoclonal antibodies and will be provided an appropriate fact sheet prior to infusion. Asencion Gowda, NP 05/21/2020 5:31 PM

## 2020-05-21 NOTE — Telephone Encounter (Signed)
Called to discuss with patient about Covid symptoms and the use of sotrovimab, bamlanivimab/etesevimab or casirivimab/imdevimab, a monoclonal antibody infusion for those with mild to moderate Covid symptoms and at a high risk of hospitalization.  Pt is qualified for this infusion at the [facility] due to; Specific high risk criteria : Diabetes and Cardiovascular disease or hypertension   Message left to call back our hotline [phone]. My chart message sent if active on Mychart. Also sent a text message.   Angelena Form PA-C

## 2020-05-22 ENCOUNTER — Ambulatory Visit (HOSPITAL_COMMUNITY)
Admission: RE | Admit: 2020-05-22 | Discharge: 2020-05-22 | Disposition: A | Discharge status: Home / Self Care | Payer: Managed Care, Other (non HMO) | Source: Ambulatory Visit | Attending: Pulmonary Disease | Admitting: Pulmonary Disease

## 2020-05-22 DIAGNOSIS — U071 COVID-19: Secondary | ICD-10-CM | POA: Insufficient documentation

## 2020-05-22 MED ORDER — SODIUM CHLORIDE 0.9 % IV SOLN: INTRAVENOUS | Status: DC | PRN

## 2020-05-22 MED ORDER — SOTROVIMAB 500 MG/8ML IV SOLN
500.0000 mg | Freq: Once | INTRAVENOUS | Status: AC
  Administered 2020-05-22: 500 mg via INTRAVENOUS

## 2020-05-22 MED ORDER — DIPHENHYDRAMINE HCL 50 MG/ML IJ SOLN: 50.0000 mg | Freq: Once | INTRAMUSCULAR | Status: DC | PRN

## 2020-05-22 MED ORDER — FAMOTIDINE IN NACL 20-0.9 MG/50ML-% IV SOLN: 20.0000 mg | Freq: Once | INTRAVENOUS | Status: DC | PRN

## 2020-05-22 MED ORDER — METHYLPREDNISOLONE SODIUM SUCC 125 MG IJ SOLR: 125.0000 mg | Freq: Once | INTRAMUSCULAR | Status: DC | PRN

## 2020-05-22 MED ORDER — EPINEPHRINE 0.3 MG/0.3ML IJ SOAJ: 0.3000 mg | Freq: Once | INTRAMUSCULAR | Status: DC | PRN

## 2020-05-22 MED ORDER — ALBUTEROL SULFATE HFA 108 (90 BASE) MCG/ACT IN AERS: 2.0000 | INHALATION_SPRAY | Freq: Once | RESPIRATORY_TRACT | Status: DC | PRN

## 2020-05-22 NOTE — Discharge Instructions (Signed)

## 2020-05-22 NOTE — Progress Notes (Signed)
  Diagnosis: COVID-19  Physician:Dr Joya Gaskins  Procedure:sotrovimab  Complications: No immediate complications noted.  Discharge: Discharged home   [place], [place] 05/22/2020

## 2020-07-12 ENCOUNTER — Other Ambulatory Visit: Payer: Self-pay

## 2020-07-12 MED ORDER — AZELASTINE-FLUTICASONE 137-50 MCG/ACT NA SUSP: NASAL | 5 refills | Status: DC

## 2020-07-14 HISTORY — PX: BREAST BIOPSY: SHX20

## 2020-08-17 ENCOUNTER — Other Ambulatory Visit: Payer: Self-pay | Admitting: Allergy and Immunology

## 2021-01-02 ENCOUNTER — Ambulatory Visit: Payer: Managed Care, Other (non HMO) | Admitting: Allergy and Immunology

## 2021-01-05 ENCOUNTER — Other Ambulatory Visit: Payer: Self-pay | Admitting: Allergy and Immunology

## 2021-01-11 ENCOUNTER — Telehealth: Payer: Self-pay | Admitting: *Deleted

## 2021-01-11 NOTE — Telephone Encounter (Signed)
Spoke with the patient and scheduled a new patient appt for 7/13 at 10:30 am with Dr Denman George; patient given an arrival time of 10 am. Patient given the address and phone number for the clinic, along with the policy for mask and visitors. Patient offered an earlier appt, patient declined to being out of the state for vacation.

## 2021-01-22 ENCOUNTER — Encounter: Payer: Self-pay | Admitting: Gynecologic Oncology

## 2021-01-22 NOTE — Patient Instructions (Addendum)
Preparing for your Surgery  Plan for surgery on February 14, 2021 with Dr. Everitt Amber at [facility]. You will be scheduled for a robotic assisted total laparoscopic hysterectomy (removal of the uterus and cervix), bilateral salpingo-oophorectomy (removal of both ovaries and fallopian tubes), sentinel lymph node biopsy, possible lymph node dissection.   Pre-operative Testing -You will receive a phone call from presurgical testing at [facility] to arrange for a pre-operative appointment and lab work.  -Bring your insurance card, copy of an advanced directive if applicable, medication list  -At that visit, you will be asked to sign a consent for a possible blood transfusion in case a transfusion becomes necessary during surgery.  The need for a blood transfusion is rare but having consent is a necessary part of your care.     -STOP TAKING YOUR ASPIRIN NOW. You should not be taking blood thinners or aspirin at least ten days prior to surgery unless instructed by your surgeon.  -Do not take supplements such as fish oil (omega 3), red yeast rice, turmeric before your surgery. You want to avoid medications with aspirin in them including headache powders such as BC or Goody's), Excedrin migraine.  Day Before Surgery at [place] will be asked to take in a light diet the day before surgery. You will be advised you can have clear liquids up until 3 hours before your surgery.    Eat a light diet the day before surgery.  Examples including soups, broths, toast, yogurt, mashed potatoes.  AVOID GAS PRODUCING FOODS. Things to avoid include carbonated beverages (fizzy beverages, sodas), raw fruits and raw vegetables (uncooked), or beans.   If your bowels are filled with gas, your surgeon will have difficulty visualizing your pelvic organs which increases your surgical risks.  Your role in recovery Your role is to become active as soon as directed by your doctor, while  still giving yourself time to heal.  Rest when you feel tired. You will be asked to do the following in order to speed your recovery:  - Cough and breathe deeply. This helps to clear and expand your lungs and can prevent pneumonia after surgery.  - [place]. Do mild physical activity. Walking or moving your legs help your circulation and body functions return to normal. Do not try to get up or walk alone the first time after surgery.   -If you develop swelling on one leg or the other, pain in the back of your leg, redness/warmth in one of your legs, please call the office or go to the Emergency Room to have a doppler to rule out a blood clot. For shortness of breath, chest pain-seek care in the Emergency Room as soon as possible. - Actively manage your pain. Managing your pain lets you move in comfort. We will ask you to rate your pain on a scale of zero to 10. It is your responsibility to tell your doctor or nurse where and how much you hurt so your pain can be treated.  Special Considerations -If you are diabetic, you may be placed on insulin after surgery to have closer control over your blood sugars to promote healing and recovery.  This does not mean that you will be discharged on insulin.  If applicable, your oral antidiabetics will be resumed when you are tolerating a solid diet.  -Your final pathology results from surgery should be available around one week after surgery and the results will  be relayed to you when available.  -FMLA forms can be faxed to [phone] and please allow 5-7 business days for completion.  Pain Management After Surgery -You have been prescribed your pain medication and bowel regimen medications before surgery so that you can have these available when you are discharged from the hospital. The pain medication is for use ONLY AFTER surgery and a new prescription will not be given.   -Make sure that you have Tylenol and Ibuprofen at home to use on  a regular basis after surgery for pain control. We recommend alternating the medications every hour to six hours since they work differently and are processed in the body differently for pain relief.  -Review the attached handout on narcotic use and their risks and side effects.   Bowel Regimen -You have been prescribed Sennakot-S to take nightly to prevent constipation especially if you are taking the narcotic pain medication intermittently.  It is important to prevent constipation and drink adequate amounts of liquids. You can stop taking this medication when you are not taking pain medication and you are back on your normal bowel routine.  Risks of Surgery Risks of surgery are low but include bleeding, infection, damage to surrounding structures, re-operation, blood clots, and very rarely death.   Blood Transfusion Information (For the consent to be signed before surgery)  We will be checking your blood type before surgery so in case of emergencies, we will know what type of blood you would need.                                            WHAT IS A BLOOD TRANSFUSION?  A transfusion is the replacement of blood or some of its parts. Blood is made up of multiple cells which provide different functions. Red blood cells carry oxygen and are used for blood loss replacement. White blood cells fight against infection. Platelets control bleeding. Plasma helps clot blood. Other blood products are available for specialized needs, such as hemophilia or other clotting disorders. BEFORE THE TRANSFUSION  Who gives blood for transfusions?  You may be able to donate blood to be used at a later date on yourself (autologous donation). Relatives can be asked to donate blood. This is generally not any safer than if you have received blood from a stranger. The same precautions are taken to ensure safety when a relative's blood is donated. Healthy volunteers who are fully evaluated to make sure their blood is  safe. This is blood bank blood. Transfusion therapy is the safest it has ever been in the practice of medicine. Before blood is taken from a donor, a complete history is taken to make sure that person has no history of diseases nor engages in risky social behavior (examples are intravenous drug use or sexual activity with multiple partners). The donor's travel history is screened to minimize risk of transmitting infections, such as malaria. The donated blood is tested for signs of infectious diseases, such as HIV and hepatitis. The blood is then tested to be sure it is compatible with you in order to minimize the chance of a transfusion reaction. If you or a relative donates blood, this is often done in anticipation of surgery and is not appropriate for emergency situations. It takes many days to process the donated blood. RISKS AND COMPLICATIONS Although transfusion therapy is very safe and saves many lives,  the main dangers of transfusion include:  Getting an infectious disease. Developing a transfusion reaction. This is an allergic reaction to something in the blood you were given. Every precaution is taken to prevent this. The decision to have a blood transfusion has been considered carefully by your caregiver before blood is given. Blood is not given unless the benefits outweigh the risks.  AFTER SURGERY INSTRUCTIONS  Return to work: 4 weeks if applicable  Activity: 1. Be up and out of the bed during the day.  Take a nap if needed.  You may walk up steps but be careful and use the hand rail.  Stair climbing will tire you more than you think, you may need to stop part way and rest.   2. No lifting or straining for 6 weeks over 10 pounds. No pushing, pulling, straining for 6 weeks.  3. No driving for 1 week(s).  Do not drive if you are taking narcotic pain medicine and make sure that your reaction time has returned.   4. You can shower as soon as the next day after surgery. Shower daily.  Use  your regular soap and water (not directly on the incision) and pat your incision(s) dry afterwards; don't rub.  No tub baths or submerging your body in water until cleared by your surgeon. If you have the soap that was given to you by pre-surgical testing that was used before surgery, you do not need to use it afterwards because this can irritate your incisions.   5. No sexual activity and nothing in the vagina for 8 weeks.  6. You may experience a small amount of clear drainage from your incisions, which is normal.  If the drainage persists, increases, or changes color please call the office.  7. Do not use creams, lotions, or ointments such as neosporin on your incisions after surgery until advised by your surgeon because they can cause removal of the dermabond glue on your incisions.    8. You may experience vaginal spotting after surgery or around the 6-8 week mark from surgery when the stitches at the top of the vagina begin to dissolve.  The spotting is normal but if you experience heavy bleeding, call our office.  9. Take Tylenol or ibuprofen first for pain and only use narcotic pain medication for severe pain not relieved by the Tylenol or Ibuprofen.  Monitor your Tylenol intake to a max of 4,000 mg in a 24 hour period. You can alternate these medications after surgery.  Diet: 1. Low sodium Heart Healthy Diet is recommended but you are cleared to resume your normal (before surgery) diet after your procedure.  2. It is safe to use a laxative, such as Miralax or Colace, if you have difficulty moving your bowels. You have been prescribed Sennakot at bedtime every evening to keep bowel movements regular and to prevent constipation.    Wound Care: 1. Keep clean and dry.  Shower daily.  Reasons to call the Doctor: Fever - Oral temperature greater than 100.4 degrees Fahrenheit Foul-smelling vaginal discharge Difficulty urinating Nausea and vomiting Increased pain at the site of the incision  that is unrelieved with pain medicine. Difficulty breathing with or without chest pain New calf pain especially if only on one side Sudden, continuing increased vaginal bleeding with or without clots.   Contacts: For questions or concerns you should contact:  Dr. Everitt Amber at [phone]  Joylene John, NP at [phone]  After Hours: call [phone] and have the GYN Oncologist  paged/contacted (after 5 pm or on the weekends).  Messages sent via mychart are for non-urgent matters and are not responded to after hours so for urgent needs, please call the after hours number.

## 2021-01-23 ENCOUNTER — Inpatient Hospital Stay: Payer: Managed Care, Other (non HMO) | Attending: Gynecologic Oncology | Admitting: Gynecologic Oncology

## 2021-01-23 ENCOUNTER — Other Ambulatory Visit: Payer: Self-pay | Admitting: Gynecologic Oncology

## 2021-01-23 ENCOUNTER — Inpatient Hospital Stay (HOSPITAL_BASED_OUTPATIENT_CLINIC_OR_DEPARTMENT_OTHER): Payer: Managed Care, Other (non HMO) | Admitting: Gynecologic Oncology

## 2021-01-23 ENCOUNTER — Encounter: Payer: Self-pay | Admitting: Gynecologic Oncology

## 2021-01-23 ENCOUNTER — Other Ambulatory Visit: Payer: Self-pay

## 2021-01-23 VITALS — BP 172/76 | HR 92 | Temp 98.1°F | Resp 16 | Ht 60.0 in | Wt 142.0 lb

## 2021-01-23 DIAGNOSIS — Z79899 Other long term (current) drug therapy: Secondary | ICD-10-CM | POA: Diagnosis not present

## 2021-01-23 DIAGNOSIS — Z7984 Long term (current) use of oral hypoglycemic drugs: Secondary | ICD-10-CM | POA: Insufficient documentation

## 2021-01-23 DIAGNOSIS — C541 Malignant neoplasm of endometrium: Secondary | ICD-10-CM

## 2021-01-23 DIAGNOSIS — E119 Type 2 diabetes mellitus without complications: Secondary | ICD-10-CM | POA: Insufficient documentation

## 2021-01-23 DIAGNOSIS — R809 Proteinuria, unspecified: Secondary | ICD-10-CM | POA: Diagnosis not present

## 2021-01-23 DIAGNOSIS — Z7982 Long term (current) use of aspirin: Secondary | ICD-10-CM | POA: Insufficient documentation

## 2021-01-23 DIAGNOSIS — I10 Essential (primary) hypertension: Secondary | ICD-10-CM | POA: Diagnosis not present

## 2021-01-23 DIAGNOSIS — E785 Hyperlipidemia, unspecified: Secondary | ICD-10-CM | POA: Diagnosis not present

## 2021-01-23 DIAGNOSIS — E7800 Pure hypercholesterolemia, unspecified: Secondary | ICD-10-CM | POA: Diagnosis not present

## 2021-01-23 DIAGNOSIS — K219 Gastro-esophageal reflux disease without esophagitis: Secondary | ICD-10-CM | POA: Insufficient documentation

## 2021-01-23 DIAGNOSIS — L309 Dermatitis, unspecified: Secondary | ICD-10-CM | POA: Diagnosis not present

## 2021-01-23 MED ORDER — TRAMADOL HCL 50 MG PO TABS: 50.0000 mg | ORAL_TABLET | Freq: Four times a day (QID) | ORAL | 0 refills | Status: DC | PRN

## 2021-01-23 MED ORDER — SENNOSIDES-DOCUSATE SODIUM 8.6-50 MG PO TABS: 2.0000 | ORAL_TABLET | Freq: Every day | ORAL | 0 refills | Status: DC

## 2021-01-23 MED ORDER — IBUPROFEN 800 MG PO TABS: 800.0000 mg | ORAL_TABLET | Freq: Three times a day (TID) | ORAL | 0 refills | Status: DC | PRN

## 2021-01-23 NOTE — Patient Instructions (Signed)
Preparing for your Surgery   Plan for surgery on February 14, 2021 with Dr. Everitt Amber at [facility]. You will be scheduled for a robotic assisted total laparoscopic hysterectomy (removal of the uterus and cervix), bilateral salpingo-oophorectomy (removal of both ovaries and fallopian tubes), sentinel lymph node biopsy, possible lymph node dissection.   Pre-operative Testing -You will receive a phone call from presurgical testing at [facility] to arrange for a pre-operative appointment and lab work.   -Bring your insurance card, copy of an advanced directive if applicable, medication list   -At that visit, you will be asked to sign a consent for a possible blood transfusion in case a transfusion becomes necessary during surgery.  The need for a blood transfusion is rare but having consent is a necessary part of your care.      -STOP TAKING YOUR ASPIRIN NOW. You should not be taking blood thinners or aspirin at least ten days prior to surgery unless instructed by your surgeon.   -Do not take supplements such as fish oil (omega 3), red yeast rice, turmeric before your surgery. You want to avoid medications with aspirin in them including headache powders such as BC or Goody's), Excedrin migraine.   Day Before Surgery at [place] will be asked to take in a light diet the day before surgery. You will be advised you can have clear liquids up until 3 hours before your surgery.     Eat a light diet the day before surgery.  Examples including soups, broths, toast, yogurt, mashed potatoes.  AVOID GAS PRODUCING FOODS. Things to avoid include carbonated beverages (fizzy beverages, sodas), raw fruits and raw vegetables (uncooked), or beans.   If your bowels are filled with gas, your surgeon will have difficulty visualizing your pelvic organs which increases your surgical risks.   Your role in recovery Your role is to become active as soon as directed by your doctor,  while still giving yourself time to heal.  Rest when you feel tired. You will be asked to do the following in order to speed your recovery:   - Cough and breathe deeply. This helps to clear and expand your lungs and can prevent pneumonia after surgery. - [place]. Do mild physical activity. Walking or moving your legs help your circulation and body functions return to normal. Do not try to get up or walk alone the first time after surgery.   -If you develop swelling on one leg or the other, pain in the back of your leg, redness/warmth in one of your legs, please call the office or go to the Emergency Room to have a doppler to rule out a blood clot. For shortness of breath, chest pain-seek care in the Emergency Room as soon as possible. - Actively manage your pain. Managing your pain lets you move in comfort. We will ask you to rate your pain on a scale of zero to 10. It is your responsibility to tell your doctor or nurse where and how much you hurt so your pain can be treated.   Special Considerations -If you are diabetic, you may be placed on insulin after surgery to have closer control over your blood sugars to promote healing and recovery.  This does not mean that you will be discharged on insulin.  If applicable, your oral antidiabetics will be resumed when you are tolerating a solid diet.   -Your final pathology results from surgery should be  available around one week after surgery and the results will be relayed to you when available.   -FMLA forms can be faxed to [phone] and please allow 5-7 business days for completion.   Pain Management After Surgery -You have been prescribed your pain medication and bowel regimen medications before surgery so that you can have these available when you are discharged from the hospital. The pain medication is for use ONLY AFTER surgery and a new prescription will not be given.   -Make sure that you have Tylenol and Ibuprofen at home  to use on a regular basis after surgery for pain control. We recommend alternating the medications every hour to six hours since they work differently and are processed in the body differently for pain relief.   -Review the attached handout on narcotic use and their risks and side effects.   Bowel Regimen -You have been prescribed Sennakot-S to take nightly to prevent constipation especially if you are taking the narcotic pain medication intermittently.  It is important to prevent constipation and drink adequate amounts of liquids. You can stop taking this medication when you are not taking pain medication and you are back on your normal bowel routine.   Risks of Surgery Risks of surgery are low but include bleeding, infection, damage to surrounding structures, re-operation, blood clots, and very rarely death.     Blood Transfusion Information (For the consent to be signed before surgery)   We will be checking your blood type before surgery so in case of emergencies, we will know what type of blood you would need.                                             WHAT IS A BLOOD TRANSFUSION?   A transfusion is the replacement of blood or some of its parts. Blood is made up of multiple cells which provide different functions. Red blood cells carry oxygen and are used for blood loss replacement. White blood cells fight against infection. Platelets control bleeding. Plasma helps clot blood. Other blood products are available for specialized needs, such as hemophilia or other clotting disorders. BEFORE THE TRANSFUSION Who gives blood for transfusions? You may be able to donate blood to be used at a later date on yourself (autologous donation). Relatives can be asked to donate blood. This is generally not any safer than if you have received blood from a stranger. The same precautions are taken to ensure safety when a relative's blood is donated. Healthy volunteers who are fully evaluated to make sure  their blood is safe. This is blood bank blood. Transfusion therapy is the safest it has ever been in the practice of medicine. Before blood is taken from a donor, a complete history is taken to make sure that person has no history of diseases nor engages in risky social behavior (examples are intravenous drug use or sexual activity with multiple partners). The donor's travel history is screened to minimize risk of transmitting infections, such as malaria. The donated blood is tested for signs of infectious diseases, such as HIV and hepatitis. The blood is then tested to be sure it is compatible with you in order to minimize the chance of a transfusion reaction. If you or a relative donates blood, this is often done in anticipation of surgery and is not appropriate for emergency situations. It takes many days to  process the donated blood. RISKS AND COMPLICATIONS Although transfusion therapy is very safe and saves many lives, the main dangers of transfusion include: Getting an infectious disease. Developing a transfusion reaction. This is an allergic reaction to something in the blood you were given. Every precaution is taken to prevent this. The decision to have a blood transfusion has been considered carefully by your caregiver before blood is given. Blood is not given unless the benefits outweigh the risks.   AFTER SURGERY INSTRUCTIONS   Return to work: 4 weeks if applicable   Activity: 1. Be up and out of the bed during the day.  Take a nap if needed.  You may walk up steps but be careful and use the hand rail.  Stair climbing will tire you more than you think, you may need to stop part way and rest.   2. No lifting or straining for 6 weeks over 10 pounds. No pushing, pulling, straining for 6 weeks.   3. No driving for 1 week(s).  Do not drive if you are taking narcotic pain medicine and make sure that your reaction time has returned.   4. You can shower as soon as the next day after surgery.  Shower daily.  Use your regular soap and water (not directly on the incision) and pat your incision(s) dry afterwards; don't rub.  No tub baths or submerging your body in water until cleared by your surgeon. If you have the soap that was given to you by pre-surgical testing that was used before surgery, you do not need to use it afterwards because this can irritate your incisions.   5. No sexual activity and nothing in the vagina for 8 weeks.   6. You may experience a small amount of clear drainage from your incisions, which is normal.  If the drainage persists, increases, or changes color please call the office.   7. Do not use creams, lotions, or ointments such as neosporin on your incisions after surgery until advised by your surgeon because they can cause removal of the dermabond glue on your incisions.     8. You may experience vaginal spotting after surgery or around the 6-8 week mark from surgery when the stitches at the top of the vagina begin to dissolve.  The spotting is normal but if you experience heavy bleeding, call our office.   9. Take Tylenol or ibuprofen first for pain and only use narcotic pain medication for severe pain not relieved by the Tylenol or Ibuprofen.  Monitor your Tylenol intake to a max of 4,000 mg in a 24 hour period. You can alternate these medications after surgery.   Diet: 1. Low sodium Heart Healthy Diet is recommended but you are cleared to resume your normal (before surgery) diet after your procedure.   2. It is safe to use a laxative, such as Miralax or Colace, if you have difficulty moving your bowels. You have been prescribed Sennakot at bedtime every evening to keep bowel movements regular and to prevent constipation.     Wound Care: 1. Keep clean and dry.  Shower daily.   Reasons to call the Doctor: Fever - Oral temperature greater than 100.4 degrees Fahrenheit Foul-smelling vaginal discharge Difficulty urinating Nausea and vomiting Increased pain  at the site of the incision that is unrelieved with pain medicine. Difficulty breathing with or without chest pain New calf pain especially if only on one side Sudden, continuing increased vaginal bleeding with or without clots.   Contacts: For  questions or concerns you should contact:   Dr. Everitt Amber at [phone]   Joylene John, NP at [phone]   After Hours: call [phone] and have the GYN Oncologist paged/contacted (after 5 pm or on the weekends).   Messages sent via mychart are for non-urgent matters and are not responded to after hours so for urgent needs, please call the after hours number.

## 2021-01-23 NOTE — H&P (View-Only) (Signed)
Consult Note: Gyn-Onc  Consult was requested by Kirsten Hodges for the evaluation of Kirsten Hodges [facility] 60 y.o. female  CC:  Chief Complaint  Patient presents with   Endometrial adenocarcinoma [facility]irst[facility] Hodges [facility])    Assessment/Plan:  Kirsten Hodges  is a 60 y.o.  year old P1 with grade 1 endometrial cancer.  A detailed discussion was held with the patient with regard to to her endometrial cancer diagnosis. We discussed the standard management options for uterine cancer which includes surgery followed possibly by adjuvant therapy depending on the results of surgery. The surgical management include a robotic assisted total hysterectomy and removal of the tubes and ovaries with sampling of lymph nodes. If a minimally invasive approach is not feasible, a laparotomy may be necessary (including for specimen delivery). The patient has been counseled about these surgical options and the risks of surgery in general including infection, bleeding, damage to surrounding structures (including bowel, bladder, ureters, nerves or vessels), and the postoperative risks of PE/ DVT, and lymphedema.  We will check coags (due to her hx of elevated LFTs) and stop her aspirin to minimize the risk of bleeding at the time of surgery.  After counseling and consideration of her options, she is in agreement to proceed with robotic assisted total hysterectomy with bilateral sapingo-oophorectomy and SLN biopsy.   She will be seen by anesthesia for preoperative clearance and discussion of postoperative pain management.  She was given the opportunity to ask questions, which were answered to her satisfaction, and she is agreement with the above mentioned plan of care.   We explained that robotic hysterectomy is typically an outpatient procedure with same day discharge provided that she is meeting appropriate discharge criteria from the PACU. We provided extensive counseling regarding post-operative expectations for  recovery and restrictions/limitations. We provided information regarding multi-modal pain therapy and the importance of avoidance of opioids. I informed the patient that I anticipated a 2-4 week recovery time off of work.  We explained that after surgery we will review her definitive pathology and determine if adjuvant therapy is recommended.   HPI: Kirsten Hodges is a 60 year old P1 who was seen in consultation at the request of Burman Riis, NP, for evaluation and treatment of grade 1 endometrial cancer.   Her symptoms began in 01/04/21 with postmenopausal bleeding.  She saw Burman Riis, an NP with [facility], for a new patient visit on 01/04/21.  Work-up of symptoms included an endometrial biopsy and [place]. Transvaginal US on 01/10/21 showed a uterus measuring 7.3 x 5 x 4.3 cm with a thin endometrium of 0.3 cm. Endometrial sampling with an endometrial Pipelle was performed on 01/04/2021 and showed FIGO grade 1 endometrioid endometrial adenocarcinoma.   The patient has medical history significant for hypertension, hypercholesterolemia, and type 2 diabetes mellitus (most recent hemoglobin A1c on 01/10/2021 was 6.3).  Her BMI is 27.7 however her height is 5 foot 0 inches.  She has been known to have a history of elevated LFTs without a diagnosis of cirrhosis or an underlying etiology for her hepatitis.  She has had 1 prior vaginal delivery in the past and no abdominal surgeries.  Her father and sister both have history of thyroid cancer.  She takes aspirin for general heart health.   Current Meds:  Outpatient Encounter Medications as of 01/23/2021  Medication Sig   aspirin EC 81 MG tablet Take 81 mg by mouth daily.   Azelastine-Fluticasone (DYMISTA) 137-50 MCG/ACT SUSP 1 spray each nostril  2 times per day   ezetimibe (ZETIA) 10 MG tablet Take 1 tablet (10 mg total) by mouth daily.   famotidine (PEPCID) 20 MG tablet Take 20 mg by mouth daily as needed for heartburn or indigestion.    glimepiride (AMARYL) 4 MG tablet Take 4 mg by mouth every morning.   JANUMET 50-1000 MG tablet Take 1 tablet by mouth 2 (two) times daily with a meal.   loratadine (CLARITIN) 10 MG tablet Take 10 mg by mouth daily.   losartan (COZAAR) 25 MG tablet Take 25 mg by mouth daily.   montelukast (SINGULAIR) 10 MG tablet Take one tablet by mouth once daily.   Multiple Vitamins-Minerals (MULTIVITAMIN WITH MINERALS) tablet Take 1 tablet by mouth daily.   rosuvastatin (CRESTOR) 10 MG tablet Take 1 tablet (10 mg total) by mouth daily.   vitamin E 180 MG (400 UNITS) capsule Take 400 Units by mouth daily.   [DISCONTINUED] azelastine (ASTELIN) 0.1 % nasal spray USE 2 SPRAYS IN EACH NOSTRIL TWICE DAILY   [DISCONTINUED] HYDROcodone-acetaminophen (NORCO/VICODIN) 5-325 MG tablet Take 1 tablet by mouth every 6 (six) hours as needed for severe pain.   [DISCONTINUED] amoxicillin-clavulanate (AUGMENTIN) 875-125 MG tablet Take 1 tablet by mouth 2 (two) times daily. One po bid x 7 days (Patient not taking: Reported on 01/03/2020)   No facility-administered encounter medications on file as of 01/23/2021.    Allergy:  Allergies  Allergen Reactions   Atorvastatin Other (See Comments)    Muscle aches Muscle aches   Colesevelam Hcl Other (See Comments)    Worsened LFTs   Enalapril Other (See Comments)    cough    Simvastatin Rash         Social Hx:   Social History   Socioeconomic History   Marital status: Single    Spouse name: Not on file   Number of children: Not on file   Years of education: Not on file   Highest education level: Not on file  Occupational History   Not on file  Tobacco Use   Smoking status: Never   Smokeless tobacco: Never  Vaping Use   Vaping Use: Never used  Substance and Sexual Activity   Alcohol use: Yes    Comment: occasionally   Drug use: No   Sexual activity: Not Currently  Other Topics Concern   Not on file  Social History Narrative   Not on file   Social  Determinants of Health   Financial Resource Strain: Not on file  Food Insecurity: Not on file  Transportation Needs: Not on file  Physical Activity: Not on file  Stress: Not on file  Social Connections: Not on file  Intimate Partner Violence: Not on file    Past Surgical Hx:  Past Surgical History:  Procedure Laterality Date   BREAST BIOPSY      Past Medical Hx:  Past Medical History:  Diagnosis Date   Allergic rhinitis    Diabetes mellitus without complication (HCC)    Eczema    Elevated liver function tests    Fibrocystic breast disease    GERD (gastroesophageal reflux disease)    Hypercholesteremia    Hyperlipidemia    Hypertension    Microalbuminuria     Past Gynecological History: SVD x1 No LMP recorded. Patient is postmenopausal.  Family Hx:  Family History  Problem Relation Age of Onset   Hypertension Mother    Hyperlipidemia Father    Thyroid cancer Father    Hyperlipidemia Brother  Stroke Brother    Breast cancer Neg Hx    Colon cancer Neg Hx    Endometrial cancer Neg Hx    Ovarian cancer Neg Hx    Pancreatic cancer Neg Hx    Prostate cancer Neg Hx     Review of Systems:  Constitutional  Feels well,    ENT Normal appearing ears and nares bilaterally Skin/Breast  No rash, sores, jaundice, itching, dryness Cardiovascular  No chest pain, shortness of breath, or edema  Pulmonary  No cough or wheeze.  Gastro Intestinal  No nausea, vomitting, or diarrhoea. No bright red blood per rectum, no abdominal pain, change in bowel movement, or constipation.  Genito Urinary  No frequency, urgency, dysuria, + postmenopausal bleeding Musculo Skeletal  No myalgia, arthralgia, joint swelling or pain  Neurologic  No weakness, numbness, change in gait,  Psychology  No depression, anxiety, insomnia.   Vitals:  Blood pressure (!) 172/76, pulse 92, temperature 98.1 F (36.7 C), temperature source Tympanic, resp. rate 16, height 5' (1.524 m), weight 142 lb  (64.4 kg), SpO2 97 %.  Physical Exam: WD in NAD Neck  Supple NROM, without any enlargements.  Lymph Node Survey No cervical supraclavicular or inguinal adenopathy Cardiovascular  Pulse normal rate, regularity and rhythm. S1 and S2 normal.  Lungs  Clear to auscultation bilateraly, without wheezes/crackles/rhonchi. Good air movement.  Skin  No rash/lesions/breakdown  Psychiatry  Alert and oriented to person, place, and time  Abdomen  Normoactive bowel sounds, abdomen soft, non-tender and mildly obese without evidence of hernia.  Back No CVA tenderness Genito Urinary  Vulva/vagina: Normal external female genitalia.  No lesions. No discharge or bleeding.  Bladder/urethra:  No lesions or masses, well supported bladder  Vagina: normal, no lesions  Cervix: Normal appearing, no lesions.  Uterus:  Small, mobile, no parametrial involvement or nodularity.  Adnexa: no palpable masses. Rectal  deferred Extremities  No bilateral cyanosis, clubbing or edema.  60 minutes of total time was spent for this patient encounter, including preparation, face-to-face counseling with the patient and coordination of care, and documentation of the encounter.   Thereasa Solo, MD  01/23/2021, 10:57 AM

## 2021-01-23 NOTE — Progress Notes (Signed)
Consult Note: Gyn-Onc  Consult was requested by Huey Romans for the evaluation of Kirsten Hodges 60 y.o. female  CC:  Chief Complaint  Patient presents with   Endometrial adenocarcinoma [facility])    Assessment/Plan:  [name]  is a 60 y.o.  year old P1 with grade 1 endometrial cancer.  A detailed discussion was held with the patient with regard to to her endometrial cancer diagnosis. We discussed the standard management options for uterine cancer which includes surgery followed possibly by adjuvant therapy depending on the results of surgery. The surgical management include a robotic assisted total hysterectomy and removal of the tubes and ovaries with sampling of lymph nodes. If a minimally invasive approach is not feasible, a laparotomy may be necessary (including for specimen delivery). The patient has been counseled about these surgical options and the risks of surgery in general including infection, bleeding, damage to surrounding structures (including bowel, bladder, ureters, nerves or vessels), and the postoperative risks of PE/ DVT, and lymphedema.  We will check coags (due to her hx of elevated LFTs) and stop her aspirin to minimize the risk of bleeding at the time of surgery.  After counseling and consideration of her options, she is in agreement to proceed with robotic assisted total hysterectomy with bilateral sapingo-oophorectomy and SLN biopsy.   She will be seen by anesthesia for preoperative clearance and discussion of postoperative pain management.  She was given the opportunity to ask questions, which were answered to her satisfaction, and she is agreement with the above mentioned plan of care.   We explained that robotic hysterectomy is typically an outpatient procedure with same day discharge provided that she is meeting appropriate discharge criteria from the PACU. We provided extensive counseling regarding post-operative expectations for  recovery and restrictions/limitations. We provided information regarding multi-modal pain therapy and the importance of avoidance of opioids. I informed the patient that I anticipated a 2-4 week recovery time off of work.  We explained that after surgery we will review her definitive pathology and determine if adjuvant therapy is recommended.   HPI: [name] is a 60 year old P1 who was seen in consultation at the request of Burman Riis, NP, for evaluation and treatment of grade 1 endometrial cancer.   Her symptoms began in 01/04/21 with postmenopausal bleeding.  She saw Burman Riis, an NP with [facility], for a new patient visit on 01/04/21.  Work-up of symptoms included an endometrial biopsy and [place]. Transvaginal US on 01/10/21 showed a uterus measuring 7.3 x 5 x 4.3 cm with a thin endometrium of 0.3 cm. Endometrial sampling with an endometrial Pipelle was performed on 01/04/2021 and showed FIGO grade 1 endometrioid endometrial adenocarcinoma.   The patient has medical history significant for hypertension, hypercholesterolemia, and type 2 diabetes mellitus (most recent hemoglobin A1c on 01/10/2021 was 6.3).  Her BMI is 27.7 however her height is 5 foot 0 inches.  She has been known to have a history of elevated LFTs without a diagnosis of cirrhosis or an underlying etiology for her hepatitis.  She has had 1 prior vaginal delivery in the past and no abdominal surgeries.  Her father and sister both have history of thyroid cancer.  She takes aspirin for general heart health.   Current Meds:  Outpatient Encounter Medications as of 01/23/2021  Medication Sig   aspirin EC 81 MG tablet Take 81 mg by mouth daily.   Azelastine-Fluticasone (DYMISTA) 137-50 MCG/ACT SUSP 1 spray each nostril  2 times per day   ezetimibe (ZETIA) 10 MG tablet Take 1 tablet (10 mg total) by mouth daily.   famotidine (PEPCID) 20 MG tablet Take 20 mg by mouth daily as needed for heartburn or indigestion.    glimepiride (AMARYL) 4 MG tablet Take 4 mg by mouth every morning.   JANUMET 50-1000 MG tablet Take 1 tablet by mouth 2 (two) times daily with a meal.   loratadine (CLARITIN) 10 MG tablet Take 10 mg by mouth daily.   losartan (COZAAR) 25 MG tablet Take 25 mg by mouth daily.   montelukast (SINGULAIR) 10 MG tablet Take one tablet by mouth once daily.   Multiple Vitamins-Minerals (MULTIVITAMIN WITH MINERALS) tablet Take 1 tablet by mouth daily.   rosuvastatin (CRESTOR) 10 MG tablet Take 1 tablet (10 mg total) by mouth daily.   vitamin E 180 MG (400 UNITS) capsule Take 400 Units by mouth daily.   [DISCONTINUED] azelastine (ASTELIN) 0.1 % nasal spray USE 2 SPRAYS IN EACH NOSTRIL TWICE DAILY   [DISCONTINUED] HYDROcodone-acetaminophen (NORCO/VICODIN) 5-325 MG tablet Take 1 tablet by mouth every 6 (six) hours as needed for severe pain.   [DISCONTINUED] amoxicillin-clavulanate (AUGMENTIN) 875-125 MG tablet Take 1 tablet by mouth 2 (two) times daily. One po bid x 7 days (Patient not taking: Reported on 01/03/2020)   No facility-administered encounter medications on file as of 01/23/2021.    Allergy:  Allergies  Allergen Reactions   Atorvastatin Other (See Comments)    Muscle aches Muscle aches   Colesevelam Hcl Other (See Comments)    Worsened LFTs   Enalapril Other (See Comments)    cough    Simvastatin Rash         Social Hx:   Social History   Socioeconomic History   Marital status: Single    Spouse name: Not on file   Number of children: Not on file   Years of education: Not on file   Highest education level: Not on file  Occupational History   Not on file  Tobacco Use   Smoking status: Never   Smokeless tobacco: Never  Vaping Use   Vaping Use: Never used  Substance and Sexual Activity   Alcohol use: Yes    Comment: occasionally   Drug use: No   Sexual activity: Not Currently  Other Topics Concern   Not on file  Social History Narrative   Not on file   Social  Determinants of Health   Financial Resource Strain: Not on file  Food Insecurity: Not on file  Transportation Needs: Not on file  Physical Activity: Not on file  Stress: Not on file  Social Connections: Not on file  Intimate Partner Violence: Not on file    Past Surgical Hx:  Past Surgical History:  Procedure Laterality Date   BREAST BIOPSY      Past Medical Hx:  Past Medical History:  Diagnosis Date   Allergic rhinitis    Diabetes mellitus without complication (HCC)    Eczema    Elevated liver function tests    Fibrocystic breast disease    GERD (gastroesophageal reflux disease)    Hypercholesteremia    Hyperlipidemia    Hypertension    Microalbuminuria     Past Gynecological History: SVD x1 No LMP recorded. Patient is postmenopausal.  Family Hx:  Family History  Problem Relation Age of Onset   Hypertension Mother    Hyperlipidemia Father    Thyroid cancer Father    Hyperlipidemia Brother  Stroke Brother    Breast cancer Neg Hx    Colon cancer Neg Hx    Endometrial cancer Neg Hx    Ovarian cancer Neg Hx    Pancreatic cancer Neg Hx    Prostate cancer Neg Hx     Review of Systems:  Constitutional  Feels well,    ENT Normal appearing ears and nares bilaterally Skin/Breast  No rash, sores, jaundice, itching, dryness Cardiovascular  No chest pain, shortness of breath, or edema  Pulmonary  No cough or wheeze.  Gastro Intestinal  No nausea, vomitting, or diarrhoea. No bright red blood per rectum, no abdominal pain, change in bowel movement, or constipation.  Genito Urinary  No frequency, urgency, dysuria, + postmenopausal bleeding Musculo Skeletal  No myalgia, arthralgia, joint swelling or pain  Neurologic  No weakness, numbness, change in gait,  Psychology  No depression, anxiety, insomnia.   Vitals:  Blood pressure (!) 172/76, pulse 92, temperature 98.1 F (36.7 C), temperature source Tympanic, resp. rate 16, height 5' (1.524 m), weight 142 lb  (64.4 kg), SpO2 97 %.  Physical Exam: WD in NAD Neck  Supple NROM, without any enlargements.  Lymph Node Survey No cervical supraclavicular or inguinal adenopathy Cardiovascular  Pulse normal rate, regularity and rhythm. S1 and S2 normal.  Lungs  Clear to auscultation bilateraly, without wheezes/crackles/rhonchi. Good air movement.  Skin  No rash/lesions/breakdown  Psychiatry  Alert and oriented to person, place, and time  Abdomen  Normoactive bowel sounds, abdomen soft, non-tender and mildly obese without evidence of hernia.  Back No CVA tenderness Genito Urinary  Vulva/vagina: Normal external female genitalia.  No lesions. No discharge or bleeding.  Bladder/urethra:  No lesions or masses, well supported bladder  Vagina: normal, no lesions  Cervix: Normal appearing, no lesions.  Uterus:  Small, mobile, no parametrial involvement or nodularity.  Adnexa: no palpable masses. Rectal  deferred Extremities  No bilateral cyanosis, clubbing or edema.  60 minutes of total time was spent for this patient encounter, including preparation, face-to-face counseling with the patient and coordination of care, and documentation of the encounter.   Thereasa Solo, MD  01/23/2021, 10:57 AM

## 2021-01-23 NOTE — Progress Notes (Signed)
Gabapentin ordered preop

## 2021-01-23 NOTE — Progress Notes (Signed)
Patient here for consultation with Dr. Everitt Amber and for a pre-operative discussion prior to her scheduled surgery on February 14, 2021. She is scheduled for a robotic assisted total laparoscopic hysterectomy, bilateral salpingo-oophorectomy, sentinel lymph node biopsy, possible lymph node dissection. The surgery was discussed in detail.  See after visit summary for additional details. Visual aids used to discuss items related to surgery including sequential compression stockings, foley catheter, IV pump, multi-modal pain regimen including tylenol, photo of the surgical robot, female reproductive system to discuss surgery in detail.      Discussed post-op pain management in detail including the aspects of the enhanced recovery pathway.  Advised her that a new prescription would be sent in for tramadol and it is only to be used for after her upcoming surgery.  We discussed the use of tylenol post-op and to monitor for a maximum of 4,000 mg in a 24 hour period.  Also prescribed sennakot to be used after surgery and to hold if having loose stools.  Discussed bowel regimen in detail.     Discussed the use of SCDs and measures to take at home to prevent DVT including frequent mobility.  Reportable signs and symptoms of DVT discussed. Post-operative instructions discussed and expectations for after surgery. Incisional care discussed as well including reportable signs and symptoms including erythema, drainage, wound separation.     10 minutes spent with the patient.  Verbalizing understanding of material discussed. No needs or concerns voiced at the end of the visit.   Advised patient to call for any needs.  Advised that her post-operative medications had been prescribed and could be picked up at any time.     This appointment is included in the global surgical bundle as pre-operative teaching and has no charge. Coags will be ordered with her preop labs given history of elevated liver enzymes.

## 2021-01-28 ENCOUNTER — Encounter: Payer: Self-pay | Admitting: Allergy and Immunology

## 2021-01-28 ENCOUNTER — Other Ambulatory Visit: Payer: Self-pay

## 2021-01-28 ENCOUNTER — Ambulatory Visit: Payer: Managed Care, Other (non HMO) | Admitting: Allergy and Immunology

## 2021-01-28 DIAGNOSIS — J3089 Other allergic rhinitis: Secondary | ICD-10-CM | POA: Diagnosis not present

## 2021-01-28 DIAGNOSIS — R768 Other specified abnormal immunological findings in serum: Secondary | ICD-10-CM

## 2021-01-28 DIAGNOSIS — L989 Disorder of the skin and subcutaneous tissue, unspecified: Secondary | ICD-10-CM | POA: Diagnosis not present

## 2021-01-28 DIAGNOSIS — J301 Allergic rhinitis due to pollen: Secondary | ICD-10-CM | POA: Diagnosis not present

## 2021-01-28 DIAGNOSIS — H1013 Acute atopic conjunctivitis, bilateral: Secondary | ICD-10-CM

## 2021-01-28 DIAGNOSIS — L509 Urticaria, unspecified: Secondary | ICD-10-CM

## 2021-01-28 DIAGNOSIS — R7989 Other specified abnormal findings of blood chemistry: Secondary | ICD-10-CM

## 2021-01-28 DIAGNOSIS — H1010 Acute atopic conjunctivitis, unspecified eye: Secondary | ICD-10-CM

## 2021-01-28 MED ORDER — AZELASTINE-FLUTICASONE 137-50 MCG/ACT NA SUSP: NASAL | 5 refills | Status: AC

## 2021-01-28 NOTE — Patient Instructions (Addendum)
  1. Continue cetirizine 10 mg 1 tablet 1 time per day   2. Continue montelukast 10 mg 1 tablet 1 time per day  3. Continue Dymista - 1 spray each nostril 1-2 times per day  4. If needed:  A. mometasone 0.1% ointment to eczema 2 times a day until resolved B. Pataday - 1 drop each eye 1 time per day  5. Use sport glasses when outside  6. Annual eye exam with eye doctor  7. Check ANA w/ reflex to ENA  8. Return to clinic in 12 months or earlier if problem

## 2021-01-28 NOTE — Progress Notes (Signed)
[facility] [place] [facility] [place]   Follow-up Note  Referring Provider: Donald Prose, MD Primary Provider: Donald Prose, MD Date of Office Visit: 01/28/2021  Subjective:   Kirsten Hodges [facility] (DOB: January 20, 1961) is a 60 y.o. female who returns to the [facility] on 01/28/2021 in re-evaluation of the following:  HPI: Kirsten Hodges returns to this clinic in evaluation of chronic urticaria and inflammatory dermatosis and allergic rhinoconjunctivitis and history of reflux and history of liver function test with positive ANA.  Her last visit to this clinic was 03 January 2020.  He has not been having any urticaria.  Although she did have an elevated alpha gal in the past she is now eating beef and other mammal with no problem.  She occasionally gets an inflammatory dermatosis affecting her hands for which she will use mometasone on an intermittent basis which works quite well.  She still gets some occasional nasal congestion and sneezing but it appears to respond quite well to the use of her Dymista.  She does get some irritated eyes especially when she is outdoors during the spring.  Her last eye exam was August 2021.  She still has elevated liver function tests.  In the past she has had a negative abdominal CT scan other than hepatic steatosis, negative hepatitis B and C screen, negative autoimmune other than an elevated ANA with a negative ENA.  She will be having a hysterectomy soon for what sounds like early stage endometrial cancer.  Allergies as of 01/28/2021       Reactions   Atorvastatin Other (See Comments)   Muscle aches Muscle aches   Colesevelam Hcl Other (See Comments)   Worsened LFTs   Enalapril Other (See Comments)   cough   Simvastatin Rash           Medication List    aspirin EC 81 MG tablet Take 81 mg by mouth daily.   Azelastine-Fluticasone 137-50 MCG/ACT Susp Commonly known as: Dymista 1 spray each nostril 2 times per day    ezetimibe 10 MG tablet Commonly known as: Zetia Take 1 tablet (10 mg total) by mouth daily.   famotidine 20 MG tablet Commonly known as: PEPCID Take 20 mg by mouth daily as needed for heartburn or indigestion.   glimepiride 4 MG tablet Commonly known as: AMARYL Take 4 mg by mouth every morning.   ibuprofen 800 MG tablet Commonly known as: ADVIL Take 1 tablet (800 mg total) by mouth every 8 (eight) hours as needed for moderate pain. For AFTER surgery only   Janumet 50-1000 MG tablet Generic drug: sitaGLIPtin-metformin Take 1 tablet by mouth 2 (two) times daily with a meal.   loratadine 10 MG tablet Commonly known as: CLARITIN Take 10 mg by mouth daily.   losartan 25 MG tablet Commonly known as: COZAAR Take 25 mg by mouth daily.   montelukast 10 MG tablet Commonly known as: SINGULAIR Take one tablet by mouth once daily.   multivitamin with minerals tablet Take 1 tablet by mouth daily.   rosuvastatin 10 MG tablet Commonly known as: Crestor Take 1 tablet (10 mg total) by mouth daily.   senna-docusate 8.6-50 MG tablet Commonly known as: Senokot-S Take 2 tablets by mouth at bedtime. For AFTER surgery, do not take if having diarrhea   traMADol 50 MG tablet Commonly known as: ULTRAM Take 1 tablet (50 mg total) by mouth every 6 (six) hours as needed for severe pain. For AFTER surgery  only, do not take and drive   vitamin E 502 MG (400 UNITS) capsule Take 400 Units by mouth daily.        Past Medical History:  Diagnosis Date   Allergic rhinitis    Diabetes mellitus without complication (HCC)    Eczema    Elevated liver function tests    Fibrocystic breast disease    GERD (gastroesophageal reflux disease)    Hypercholesteremia    Hyperlipidemia    Hypertension    Microalbuminuria     Past Surgical History:  Procedure Laterality Date   BREAST BIOPSY      Review of systems negative except as noted in HPI / PMHx or noted below:  Review of Systems   Constitutional: Negative.   HENT: Negative.    Eyes: Negative.   Respiratory: Negative.    Cardiovascular: Negative.   Gastrointestinal: Negative.   Genitourinary: Negative.   Musculoskeletal: Negative.   Skin: Negative.   Neurological: Negative.   Endo/Heme/Allergies: Negative.   Psychiatric/Behavioral: Negative.      Objective:   Vitals:   01/28/21 1753  BP: 118/72  Pulse: 97  Resp: 16  SpO2: 96%   Height: 5' 0.1" (152.7 cm)  Weight: 141 lb 1.6 oz (64 kg)   Physical Exam Constitutional:      Appearance: She is not diaphoretic.  HENT:     Head: Normocephalic.     Right Ear: Tympanic membrane, ear canal and external ear normal.     Left Ear: Tympanic membrane, ear canal and external ear normal.     Nose: Nose normal. No mucosal edema or rhinorrhea.     Mouth/Throat:     Pharynx: Uvula midline. No oropharyngeal exudate.  Eyes:     Conjunctiva/sclera: Conjunctivae normal.  Neck:     Thyroid: No thyromegaly.     Trachea: Trachea normal. No tracheal tenderness or tracheal deviation.  Cardiovascular:     Rate and Rhythm: Normal rate and regular rhythm.     Heart sounds: Normal heart sounds, S1 normal and S2 normal. No murmur heard. Pulmonary:     Effort: No respiratory distress.     Breath sounds: Normal breath sounds. No stridor. No wheezing or rales.  Lymphadenopathy:     Head:     Right side of head: No tonsillar adenopathy.     Left side of head: No tonsillar adenopathy.     Cervical: No cervical adenopathy.  Skin:    Findings: Rash (Nonerythematous slightly lichenified slightly scaly patch left dorsum hand) present. No erythema.     Nails: There is no clubbing.  Neurological:     Mental Status: She is alert.    Diagnostics: none   Assessment and Plan:   1. Urticaria   2. Perennial allergic rhinitis   3. Inflammatory dermatosis   4. Seasonal allergic rhinitis due to pollen   5. Seasonal allergic conjunctivitis   6. Elevated liver function tests    7. Elevated antinuclear antibody (ANA) level     1. Continue cetirizine 10 mg 1 tablet 1 time per day   2. Continue montelukast 10 mg 1 tablet 1 time per day  3. Continue Dymista - 1 spray each nostril 1-2 times per day  4. If needed:  A. mometasone 0.1% ointment to eczema 2 times a day until resolved B. Pataday - 1 drop each eye 1 time per day  5. Use sport glasses when outside  6. Annual eye exam with eye doctor  7. Check ANA w/ reflex  to ENA  8. Return to clinic in 12 months or earlier if problem   Kirsten Hodges appears to be doing pretty well regarding her issues for which she is seen in this clinic including her allergic rhinoconjunctivitis and intermittent urticaria and intermittent inflammatory dermatosis although her conjunctivitis is a little more active.  She still apparently has elevated liver function tests with a negative screen for systemic disease contributing to that issue.  Because of her elevated ANA in the past we will follow that up with a repeat of her ANA.  Allena Katz, MD Allergy / Immunology [place]

## 2021-01-29 ENCOUNTER — Encounter: Payer: Self-pay | Admitting: Allergy and Immunology

## 2021-01-29 ENCOUNTER — Encounter (HOSPITAL_BASED_OUTPATIENT_CLINIC_OR_DEPARTMENT_OTHER): Payer: Self-pay | Admitting: Gynecologic Oncology

## 2021-01-31 ENCOUNTER — Encounter (HOSPITAL_BASED_OUTPATIENT_CLINIC_OR_DEPARTMENT_OTHER): Payer: Self-pay | Admitting: Gynecologic Oncology

## 2021-01-31 ENCOUNTER — Other Ambulatory Visit: Payer: Self-pay

## 2021-01-31 LAB — ANA W/REFLEX: Anti Nuclear Antibody (ANA): NEGATIVE

## 2021-01-31 NOTE — Progress Notes (Signed)
Your procedure is scheduled on 02-14-2021  Report to [facility]   Call this number if you have problems the morning of surgery  [phone].   OUR ADDRESS IS [address] [address].  WE ARE LOCATED IN [facility].  PLEASE BRING YOUR INSURANCE CARD AND PHOTO ID DAY OF SURGERY.  ONLY ONE PERSON ALLOWED IN FACILITY WAITING AREA.                                     REMEMBER:  Eat a light diet the day before surgery.  Examples including soups, broths, toast, yogurt, mashed potatoes.  Things to avoid include carbonated beverages (fizzy beverages), raw fruits and raw vegetables, or beans.   If your bowels are filled with gas, your surgeon will have difficulty visualizing your pelvic organs which increases your surgical risks.    DO NOT EAT FOOD, CANDY GUM OR MINTS  AFTER MIDNIGHT . YOU MAY HAVE CLEAR LIQUIDS FROM MIDNIGHT UNTIL 830 AM. NO CLEAR LIQUIDS AFTER  830 AM DAY OF SURGERY.   YOU MAY  BRUSH YOUR TEETH MORNING OF SURGERY AND RINSE YOUR MOUTH OUT, NO CHEWING GUM CANDY OR MINTS.    CLEAR LIQUID DIET   Foods Allowed                                                                     Foods Excluded  Coffee and tea, regular and decaf                             liquids that you cannot  Plain Jell-O any favor except red or purple                                           see through such as: Fruit ices (not with fruit pulp)                                     milk, soups, orange juice  Iced Popsicles                       Cranberry, grape and apple juices Sports drinks like Gatorade Lightly seasoned clear broth or consume(fat free) Sugar, honey syrup  Sample Menu Breakfast                                Lunch                                     Supper Cranberry juice                    Beef broth  Chicken broth Jell-O                                     Grape juice                           Apple  juice Coffee or tea                        Jell-O                                      Popsicle                                                Coffee or tea                        Coffee or tea  _____________________________________________________________________     TAKE THESE MEDICATIONS MORNING OF SURGERY WITH A SIP OF WATER:   DYMISTA.  ONE VISITOR IS ALLOWED IN WAITING ROOM ONLY DAY OF SURGERY.  NO VISITOR MAY SPEND THE NIGHT.  VISITOR ARE ALLOWED TO STAY UNTIL 800 PM.                                    DO NOT WEAR JEWERLY, MAKE UP. DO NOT WEAR LOTIONS, POWDERS, PERFUMES OR NAIL POLISH. DO NOT SHAVE FOR 48 HOURS PRIOR TO DAY OF SURGERY. MEN MAY SHAVE FACE AND NECK. CONTACTS, GLASSES, OR DENTURES MAY NOT BE WORN TO SURGERY.                                    [facility] IS NOT RESPONSIBLE  FOR ANY BELONGINGS.                                                                    Marland Kitchen           [place] - Preparing for Surgery Before surgery, you can play an important role.  Because skin is not sterile, your skin needs to be as free of germs as possible.  You can reduce the number of germs on your skin by washing with CHG (chlorahexidine gluconate) soap before surgery.  CHG is an antiseptic cleaner which kills germs and bonds with the skin to continue killing germs even after washing. Please DO NOT use if you have an allergy to CHG or antibacterial soaps.  If your skin becomes reddened/irritated stop using the CHG and inform your nurse when you arrive at Short Stay. Do not shave (including legs and underarms) for at least 48 hours prior to the first CHG shower.  You may shave your face/neck. Please follow these instructions carefully:  1.  Shower with CHG Soap  the night before surgery and the  morning of Surgery.  2.  If you choose to wash your hair, wash your hair first as usual with your  normal  shampoo.  3.  After you shampoo, rinse your hair and body thoroughly to remove the  shampoo.                             4.  Use CHG as you would any other liquid soap.  You can apply chg directly  to the skin and wash                      Gently with a scrungie or clean washcloth.  5.  Apply the CHG Soap to your body ONLY FROM THE NECK DOWN.   Do not use on face/ open                           Wound or open sores. Avoid contact with eyes, ears mouth and genitals (private parts).                       Wash face,  Genitals (private parts) with your normal soap.             6.  Wash thoroughly, paying special attention to the area where your surgery  will be performed.  7.  Thoroughly rinse your body with warm water from the neck down.  8.  DO NOT shower/wash with your normal soap after using and rinsing off  the CHG Soap.                9.  Pat yourself dry with a clean towel.            10.  Wear clean pajamas.            11.  Place clean sheets on your bed the night of your first shower and do not  sleep with pets. Day of Surgery : Do not apply any lotions/deodorants the morning of surgery.  Please wear clean clothes to the hospital/surgery center.  FAILURE TO FOLLOW THESE INSTRUCTIONS MAY RESULT IN THE CANCELLATION OF YOUR SURGERY PATIENT SIGNATURE_________________________________  NURSE SIGNATURE__________________________________  ________________________________________________________________________                                                        QUESTIONS Hansel Feinstein PRE OP NURSE PHONE [phone].

## 2021-01-31 NOTE — Progress Notes (Addendum)
Spoke w/ via phone for pre-op interview---pt Lab needs dos----  none             Lab results------lab appt 02-12-2021 230 pm for cbc cmp pt ptt [place] ekg COVID test -----patient states asymptomatic no test needed Arrive at -------02-14-2021 930 am  Light diet day before surgery, NPO after MN NO Solid Food.  Clear liquids from MN until---830 am then npo Med rec completed Medications to take morning of surgery -----dymista Diabetic medication -----none day of surgery Patient instructed no nail polish to be worn day of surgery Patient instructed to bring photo id and insurance card day of surgery Patient aware to have Driver (ride ) / caregiver   sister agnes  for 24 hours after surgery  Patient Special Instructions -----none Pre-Op special Istructions -----none Patient verbalized understanding of instructions that were given at this phone interview. Patient denies shortness of breath, chest pain, fever, cough at this phone interview.   Pt requests advanced directive  forms with lab appt 02-12-2021 forms placed in bag with soap and instructions  CT SOFT TISSUE NECK 09-13-2019 Epic LOV DR ERIC KOZLOW 01-28-2021 EPIC

## 2021-02-01 ENCOUNTER — Telehealth: Payer: Self-pay

## 2021-02-01 NOTE — Telephone Encounter (Signed)
Returning Kirsten Hodges phone call regarding FMLA paperwork. We have received her documents and are currently working on them. Our office will let her know once they have been completed. Patient verbalized understanding.

## 2021-02-04 ENCOUNTER — Telehealth: Payer: Self-pay

## 2021-02-04 NOTE — Telephone Encounter (Signed)
Notified Kirsten Hodges that her FMLA paperwork has been completed and was faxed on Friday 02/01/21. Patient verbalized understanding.

## 2021-02-05 ENCOUNTER — Telehealth: Payer: Self-pay | Admitting: *Deleted

## 2021-02-08 ENCOUNTER — Other Ambulatory Visit: Payer: Self-pay | Admitting: Allergy and Immunology

## 2021-02-12 ENCOUNTER — Other Ambulatory Visit: Payer: Self-pay

## 2021-02-12 ENCOUNTER — Encounter (HOSPITAL_COMMUNITY)
Admission: RE | Admit: 2021-02-12 | Discharge: 2021-02-12 | Disposition: A | Discharge status: Home / Self Care | Payer: Managed Care, Other (non HMO) | Source: Ambulatory Visit | Attending: Gynecologic Oncology | Admitting: Gynecologic Oncology

## 2021-02-12 DIAGNOSIS — C541 Malignant neoplasm of endometrium: Secondary | ICD-10-CM

## 2021-02-12 DIAGNOSIS — Z01818 Encounter for other preprocedural examination: Secondary | ICD-10-CM | POA: Insufficient documentation

## 2021-02-12 LAB — URINALYSIS, ROUTINE W REFLEX MICROSCOPIC
Bilirubin Urine: NEGATIVE
Glucose, UA: NEGATIVE mg/dL
Hgb urine dipstick: NEGATIVE
Ketones, ur: NEGATIVE mg/dL
Leukocytes,Ua: NEGATIVE
Nitrite: NEGATIVE
Protein, ur: NEGATIVE mg/dL
Specific Gravity, Urine: 1.012 (ref 1.005–1.030)
pH: 5 (ref 5.0–8.0)

## 2021-02-12 LAB — COMPREHENSIVE METABOLIC PANEL
ALT: 66 U/L — ABNORMAL HIGH (ref 0–44)
AST: 48 U/L — ABNORMAL HIGH (ref 15–41)
Albumin: 4.6 g/dL (ref 3.5–5.0)
Alkaline Phosphatase: 88 U/L (ref 38–126)
Anion gap: 9 (ref 5–15)
BUN: 11 mg/dL (ref 8–23)
CO2: 25 mmol/L (ref 22–32)
Calcium: 9.3 mg/dL (ref 8.9–10.3)
Chloride: 101 mmol/L (ref 98–111)
Creatinine, Ser: 0.54 mg/dL (ref 0.44–1.00)
GFR, Estimated: >60 mL/min (ref >60)
Glucose, Bld: 227 mg/dL — ABNORMAL HIGH (ref 70–99)
Potassium: 3.8 mmol/L (ref 3.5–5.1)
Sodium: 135 mmol/L (ref 135–145)
Total Bilirubin: 0.8 mg/dL (ref 0.3–1.2)
Total Protein: 8 g/dL (ref 6.5–8.1)

## 2021-02-12 LAB — CBC
HCT: 40.1 % (ref 36.0–46.0)
Hemoglobin: 13 g/dL (ref 12.0–15.0)
MCH: 29 pg (ref 26.0–34.0)
MCHC: 32.4 g/dL (ref 30.0–36.0)
MCV: 89.5 fL (ref 80.0–100.0)
Platelets: 239 10*3/uL (ref 150–400)
RBC: 4.48 MIL/uL (ref 3.87–5.11)
RDW: 12.8 % (ref 11.5–15.5)
WBC: 8.5 10*3/uL (ref 4.0–10.5)
nRBC: 0 % (ref 0.0–0.2)

## 2021-02-12 LAB — PROTIME-INR
INR: 1.1 (ref 0.8–1.2)
Prothrombin Time: 14 seconds (ref 11.4–15.2)

## 2021-02-12 LAB — APTT: aPTT: 33 seconds (ref 24–36)

## 2021-02-13 ENCOUNTER — Telehealth: Payer: Self-pay

## 2021-02-13 NOTE — Telephone Encounter (Signed)
Telephone call to check on pre-operative status.  Patient compliant with pre-operative instructions.  Reinforced NPO after midnight. Reviewed recent labs with patient, instructed not to take tylenol or have any alcohol with elevated liver enzymes. Also instructed to have a tight control on her blood sugar and not to have sugar per Joylene John, NP. No questions or concerns voiced.  Instructed to call for any needs.

## 2021-02-13 NOTE — Telephone Encounter (Signed)
Recent lab work successfully faxed to PCP, confirmation received.

## 2021-02-13 NOTE — Anesthesia Preprocedure Evaluation (Addendum)
Anesthesia Evaluation  Patient identified by MRN, date of birth, ID band Patient awake    Reviewed: Allergy & Precautions, NPO status , Patient's Chart, lab work & pertinent test results  Airway Mallampati: II  TM Distance: >3 FB Neck ROM: Full    Dental no notable dental hx. (+) Teeth Intact, Dental Advisory Given   Pulmonary neg pulmonary ROS,    Pulmonary exam normal breath sounds clear to auscultation       Cardiovascular hypertension, Normal cardiovascular exam Rhythm:Regular Rate:Normal     Neuro/Psych    GI/Hepatic Neg liver ROS, GERD  ,  Endo/Other  diabetes (POC gluc 141), Type 2, Oral Hypoglycemic Agents  Renal/GU Lab Results      Component                Value               Date                      CREATININE               0.54                02/12/2021                BUN                      11                  02/12/2021                NA                       135                 02/12/2021                K                        3.8                 02/12/2021                CL                       101                 02/12/2021                CO2                      25                  02/12/2021             Female GU complaint (Endometrial ca)     Musculoskeletal negative musculoskeletal ROS (+)   Abdominal   Peds  Hematology Lab Results      Component                Value               Date                      WBC  8.5                 02/12/2021                HGB                      13.0                02/12/2021                HCT                      40.1                02/12/2021                MCV                      89.5                02/12/2021                PLT                      239                 02/12/2021              Anesthesia Other Findings All: Atorvastatin, enalapril, simvastatin, coleselam  Reproductive/Obstetrics                            Anesthesia Physical Anesthesia Plan  ASA: 3  Anesthesia Plan: General   Post-op Pain Management:    Induction: Intravenous  PONV Risk Score and Plan: 4 or greater and Treatment may vary due to age or medical condition, Midazolam, Dexamethasone and Ondansetron  Airway Management Planned: Oral ETT  Additional Equipment: None  Intra-op Plan:   Post-operative Plan: Extubation in OR  Informed Consent: I have reviewed the patients History and Physical, chart, labs and discussed the procedure including the risks, benefits and alternatives for the proposed anesthesia with the patient or authorized representative who has indicated his/her understanding and acceptance.     Dental advisory given  Plan Discussed with: CRNA and Anesthesiologist  Anesthesia Plan Comments: (GA w lidocaine infusion ketamine)       Anesthesia Quick Evaluation

## 2021-02-14 ENCOUNTER — Encounter (HOSPITAL_BASED_OUTPATIENT_CLINIC_OR_DEPARTMENT_OTHER)
Admission: RE | Disposition: A | Discharge status: Home / Self Care | Payer: Self-pay | Source: Home / Self Care | Attending: Gynecologic Oncology

## 2021-02-14 ENCOUNTER — Ambulatory Visit (HOSPITAL_BASED_OUTPATIENT_CLINIC_OR_DEPARTMENT_OTHER): Payer: Managed Care, Other (non HMO) | Admitting: Anesthesiology

## 2021-02-14 ENCOUNTER — Ambulatory Visit (HOSPITAL_BASED_OUTPATIENT_CLINIC_OR_DEPARTMENT_OTHER)
Admission: RE | Admit: 2021-02-14 | Discharge: 2021-02-14 | Disposition: A | Discharge status: Home / Self Care | Payer: Managed Care, Other (non HMO) | Attending: Gynecologic Oncology | Admitting: Gynecologic Oncology

## 2021-02-14 ENCOUNTER — Encounter (HOSPITAL_BASED_OUTPATIENT_CLINIC_OR_DEPARTMENT_OTHER): Payer: Self-pay | Admitting: Gynecologic Oncology

## 2021-02-14 DIAGNOSIS — C541 Malignant neoplasm of endometrium: Secondary | ICD-10-CM | POA: Diagnosis present

## 2021-02-14 DIAGNOSIS — Z7982 Long term (current) use of aspirin: Secondary | ICD-10-CM | POA: Diagnosis not present

## 2021-02-14 DIAGNOSIS — D259 Leiomyoma of uterus, unspecified: Secondary | ICD-10-CM | POA: Diagnosis not present

## 2021-02-14 DIAGNOSIS — N83202 Unspecified ovarian cyst, left side: Secondary | ICD-10-CM | POA: Diagnosis not present

## 2021-02-14 DIAGNOSIS — E7800 Pure hypercholesterolemia, unspecified: Secondary | ICD-10-CM | POA: Insufficient documentation

## 2021-02-14 DIAGNOSIS — Z808 Family history of malignant neoplasm of other organs or systems: Secondary | ICD-10-CM | POA: Diagnosis present

## 2021-02-14 DIAGNOSIS — N800 Endometriosis of uterus: Secondary | ICD-10-CM | POA: Diagnosis not present

## 2021-02-14 DIAGNOSIS — Z8349 Family history of other endocrine, nutritional and metabolic diseases: Secondary | ICD-10-CM | POA: Insufficient documentation

## 2021-02-14 DIAGNOSIS — Z823 Family history of stroke: Secondary | ICD-10-CM | POA: Diagnosis not present

## 2021-02-14 DIAGNOSIS — Z888 Allergy status to other drugs, medicaments and biological substances status: Secondary | ICD-10-CM | POA: Diagnosis not present

## 2021-02-14 DIAGNOSIS — N83201 Unspecified ovarian cyst, right side: Secondary | ICD-10-CM | POA: Diagnosis not present

## 2021-02-14 DIAGNOSIS — E119 Type 2 diabetes mellitus without complications: Secondary | ICD-10-CM | POA: Insufficient documentation

## 2021-02-14 DIAGNOSIS — Z7984 Long term (current) use of oral hypoglycemic drugs: Secondary | ICD-10-CM | POA: Insufficient documentation

## 2021-02-14 DIAGNOSIS — N840 Polyp of corpus uteri: Secondary | ICD-10-CM | POA: Insufficient documentation

## 2021-02-14 DIAGNOSIS — Z79899 Other long term (current) drug therapy: Secondary | ICD-10-CM | POA: Insufficient documentation

## 2021-02-14 DIAGNOSIS — I10 Essential (primary) hypertension: Secondary | ICD-10-CM | POA: Insufficient documentation

## 2021-02-14 DIAGNOSIS — Z8249 Family history of ischemic heart disease and other diseases of the circulatory system: Secondary | ICD-10-CM | POA: Diagnosis not present

## 2021-02-14 DIAGNOSIS — N838 Other noninflammatory disorders of ovary, fallopian tube and broad ligament: Secondary | ICD-10-CM | POA: Insufficient documentation

## 2021-02-14 HISTORY — PX: SENTINEL NODE BIOPSY: SHX6608

## 2021-02-14 HISTORY — PX: ROBOTIC ASSISTED TOTAL HYSTERECTOMY WITH BILATERAL SALPINGO OOPHERECTOMY: SHX6086

## 2021-02-14 HISTORY — DX: Malignant (primary) neoplasm, unspecified: C80.1

## 2021-02-14 HISTORY — PX: LYMPHADENECTOMY: SHX5960

## 2021-02-14 LAB — TYPE AND SCREEN
ABO/RH(D): O POS
Antibody Screen: NEGATIVE

## 2021-02-14 LAB — ABO/RH: ABO/RH(D): O POS

## 2021-02-14 LAB — GLUCOSE, CAPILLARY
Glucose-Capillary: 143 mg/dL — ABNORMAL HIGH (ref 70–99)
Glucose-Capillary: 146 mg/dL — ABNORMAL HIGH (ref 70–99)

## 2021-02-14 SURGERY — HYSTERECTOMY, TOTAL, ROBOT-ASSISTED, LAPAROSCOPIC, WITH BILATERAL SALPINGO-OOPHORECTOMY
Anesthesia: General | Site: Abdomen

## 2021-02-14 MED ORDER — KETOROLAC TROMETHAMINE 30 MG/ML IJ SOLN
INTRAMUSCULAR | Status: DC | PRN
  Administered 2021-02-14: 30 mg via INTRAVENOUS

## 2021-02-14 MED ORDER — GABAPENTIN 300 MG PO CAPS
ORAL_CAPSULE | ORAL | Status: AC
  Filled 2021-02-14: qty 1

## 2021-02-14 MED ORDER — DEXAMETHASONE SODIUM PHOSPHATE 10 MG/ML IJ SOLN
INTRAMUSCULAR | Status: DC | PRN
  Administered 2021-02-14: 5 mg via INTRAVENOUS

## 2021-02-14 MED ORDER — OXYCODONE HCL 5 MG PO TABS: 5.0000 mg | ORAL_TABLET | Freq: Once | ORAL | Status: DC | PRN

## 2021-02-14 MED ORDER — LIDOCAINE 2% (20 MG/ML) 5 ML SYRINGE
INTRAMUSCULAR | Status: DC | PRN
  Administered 2021-02-14: 100 mg via INTRAVENOUS

## 2021-02-14 MED ORDER — ONDANSETRON HCL 4 MG/2ML IJ SOLN: 4.0000 mg | Freq: Once | INTRAMUSCULAR | Status: DC | PRN

## 2021-02-14 MED ORDER — ACETAMINOPHEN 500 MG PO TABS
ORAL_TABLET | ORAL | Status: AC
  Filled 2021-02-14: qty 1

## 2021-02-14 MED ORDER — ONDANSETRON HCL 4 MG/2ML IJ SOLN
INTRAMUSCULAR | Status: DC | PRN
  Administered 2021-02-14: 4 mg via INTRAVENOUS

## 2021-02-14 MED ORDER — MIDAZOLAM HCL 2 MG/2ML IJ SOLN
INTRAMUSCULAR | Status: DC | PRN
  Administered 2021-02-14: 2 mg via INTRAVENOUS

## 2021-02-14 MED ORDER — LIDOCAINE HCL (PF) 2 % IJ SOLN
INTRAMUSCULAR | Status: AC
  Filled 2021-02-14: qty 10

## 2021-02-14 MED ORDER — LIDOCAINE 2% (20 MG/ML) 5 ML SYRINGE
INTRAMUSCULAR | Status: DC | PRN
  Administered 2021-02-14: 1.5 mg/kg/h via INTRAVENOUS

## 2021-02-14 MED ORDER — CEFAZOLIN SODIUM-DEXTROSE 2-4 GM/100ML-% IV SOLN
INTRAVENOUS | Status: AC
  Filled 2021-02-14: qty 100

## 2021-02-14 MED ORDER — ROCURONIUM BROMIDE 10 MG/ML (PF) SYRINGE
PREFILLED_SYRINGE | INTRAVENOUS | Status: DC | PRN
  Administered 2021-02-14: 50 mg via INTRAVENOUS

## 2021-02-14 MED ORDER — KETOROLAC TROMETHAMINE 30 MG/ML IJ SOLN
INTRAMUSCULAR | Status: AC
  Filled 2021-02-14: qty 1

## 2021-02-14 MED ORDER — CEFAZOLIN SODIUM-DEXTROSE 2-4 GM/100ML-% IV SOLN
2.0000 g | INTRAVENOUS | Status: AC
  Administered 2021-02-14: 2 g via INTRAVENOUS

## 2021-02-14 MED ORDER — CELECOXIB 200 MG PO CAPS
400.0000 mg | ORAL_CAPSULE | ORAL | Status: AC
  Administered 2021-02-14: 400 mg via ORAL

## 2021-02-14 MED ORDER — ONDANSETRON HCL 4 MG/2ML IJ SOLN
INTRAMUSCULAR | Status: AC
  Filled 2021-02-14: qty 2

## 2021-02-14 MED ORDER — HYDROMORPHONE HCL 1 MG/ML IJ SOLN: 0.2500 mg | INTRAMUSCULAR | Status: DC | PRN

## 2021-02-14 MED ORDER — PHENYLEPHRINE HCL (PRESSORS) 10 MG/ML IV SOLN
INTRAVENOUS | Status: DC | PRN
  Administered 2021-02-14: 80 ug via INTRAVENOUS
  Administered 2021-02-14 (×2): 120 ug via INTRAVENOUS

## 2021-02-14 MED ORDER — ACETAMINOPHEN 500 MG PO TABS
ORAL_TABLET | ORAL | Status: AC
  Filled 2021-02-14: qty 2

## 2021-02-14 MED ORDER — SCOPOLAMINE 1 MG/3DAYS TD PT72
MEDICATED_PATCH | TRANSDERMAL | Status: AC
  Filled 2021-02-14: qty 1

## 2021-02-14 MED ORDER — AMISULPRIDE (ANTIEMETIC) 5 MG/2ML IV SOLN: 10.0000 mg | Freq: Once | INTRAVENOUS | Status: DC | PRN

## 2021-02-14 MED ORDER — CELECOXIB 200 MG PO CAPS
ORAL_CAPSULE | ORAL | Status: AC
  Filled 2021-02-14: qty 2

## 2021-02-14 MED ORDER — ENOXAPARIN SODIUM 40 MG/0.4ML IJ SOSY
PREFILLED_SYRINGE | INTRAMUSCULAR | Status: AC
  Filled 2021-02-14: qty 0.4

## 2021-02-14 MED ORDER — ACETAMINOPHEN 500 MG PO TABS
1000.0000 mg | ORAL_TABLET | ORAL | Status: AC
  Administered 2021-02-14: 1000 mg via ORAL

## 2021-02-14 MED ORDER — PHENYLEPHRINE 40 MCG/ML (10ML) SYRINGE FOR IV PUSH (FOR BLOOD PRESSURE SUPPORT)
PREFILLED_SYRINGE | INTRAVENOUS | Status: AC
  Filled 2021-02-14: qty 20

## 2021-02-14 MED ORDER — TRAMADOL HCL 50 MG PO TABS
50.0000 mg | ORAL_TABLET | Freq: Once | ORAL | Status: AC
  Administered 2021-02-14: 50 mg via ORAL

## 2021-02-14 MED ORDER — DEXAMETHASONE SODIUM PHOSPHATE 10 MG/ML IJ SOLN: 4.0000 mg | INTRAMUSCULAR | Status: DC

## 2021-02-14 MED ORDER — LACTATED RINGERS IV SOLN: INTRAVENOUS | Status: DC

## 2021-02-14 MED ORDER — GABAPENTIN 300 MG PO CAPS
300.0000 mg | ORAL_CAPSULE | ORAL | Status: AC
  Administered 2021-02-14: 300 mg via ORAL

## 2021-02-14 MED ORDER — LIDOCAINE HCL (PF) 2 % IJ SOLN
INTRAMUSCULAR | Status: AC
  Filled 2021-02-14: qty 5

## 2021-02-14 MED ORDER — OXYCODONE HCL 5 MG/5ML PO SOLN: 5.0000 mg | Freq: Once | ORAL | Status: DC | PRN

## 2021-02-14 MED ORDER — DEXAMETHASONE SODIUM PHOSPHATE 10 MG/ML IJ SOLN
INTRAMUSCULAR | Status: AC
  Filled 2021-02-14: qty 1

## 2021-02-14 MED ORDER — PROPOFOL 10 MG/ML IV BOLUS
INTRAVENOUS | Status: DC | PRN
  Administered 2021-02-14: 160 mg via INTRAVENOUS

## 2021-02-14 MED ORDER — PROPOFOL 10 MG/ML IV BOLUS
INTRAVENOUS | Status: AC
  Filled 2021-02-14: qty 20

## 2021-02-14 MED ORDER — BUPIVACAINE HCL 0.25 % IJ SOLN
INTRAMUSCULAR | Status: DC | PRN
  Administered 2021-02-14: 17 mL

## 2021-02-14 MED ORDER — FENTANYL CITRATE (PF) 100 MCG/2ML IJ SOLN
INTRAMUSCULAR | Status: DC | PRN
  Administered 2021-02-14: 50 ug via INTRAVENOUS
  Administered 2021-02-14 (×2): 25 ug via INTRAVENOUS

## 2021-02-14 MED ORDER — DEXMEDETOMIDINE (PRECEDEX) IN NS 20 MCG/5ML (4 MCG/ML) IV SYRINGE
PREFILLED_SYRINGE | INTRAVENOUS | Status: AC
  Filled 2021-02-14: qty 5

## 2021-02-14 MED ORDER — STERILE WATER FOR INJECTION IJ SOLN
INTRAMUSCULAR | Status: DC | PRN
  Administered 2021-02-14: 5 mL

## 2021-02-14 MED ORDER — KETOROLAC TROMETHAMINE 30 MG/ML IJ SOLN: 30.0000 mg | Freq: Once | INTRAMUSCULAR | Status: DC | PRN

## 2021-02-14 MED ORDER — SUGAMMADEX SODIUM 200 MG/2ML IV SOLN
INTRAVENOUS | Status: DC | PRN
  Administered 2021-02-14: 150 mg via INTRAVENOUS

## 2021-02-14 MED ORDER — SODIUM CHLORIDE 0.9 % IR SOLN
Status: DC | PRN
  Administered 2021-02-14: 1000 mL

## 2021-02-14 MED ORDER — FENTANYL CITRATE (PF) 100 MCG/2ML IJ SOLN
INTRAMUSCULAR | Status: AC
  Filled 2021-02-14: qty 2

## 2021-02-14 MED ORDER — TRAMADOL HCL 50 MG PO TABS
ORAL_TABLET | ORAL | Status: AC
  Filled 2021-02-14: qty 1

## 2021-02-14 MED ORDER — ENOXAPARIN SODIUM 40 MG/0.4ML IJ SOSY
40.0000 mg | PREFILLED_SYRINGE | INTRAMUSCULAR | Status: AC
  Administered 2021-02-14: 40 mg via SUBCUTANEOUS

## 2021-02-14 MED ORDER — SCOPOLAMINE 1 MG/3DAYS TD PT72
1.0000 | MEDICATED_PATCH | TRANSDERMAL | Status: DC
  Administered 2021-02-14: 1.5 mg via TRANSDERMAL

## 2021-02-14 MED ORDER — DEXMEDETOMIDINE (PRECEDEX) IN NS 20 MCG/5ML (4 MCG/ML) IV SYRINGE
PREFILLED_SYRINGE | INTRAVENOUS | Status: DC | PRN
  Administered 2021-02-14: 8 ug via INTRAVENOUS
  Administered 2021-02-14: 4 ug via INTRAVENOUS

## 2021-02-14 MED ORDER — ROCURONIUM BROMIDE 10 MG/ML (PF) SYRINGE
PREFILLED_SYRINGE | INTRAVENOUS | Status: AC
  Filled 2021-02-14: qty 10

## 2021-02-14 MED ORDER — MIDAZOLAM HCL 2 MG/2ML IJ SOLN
INTRAMUSCULAR | Status: AC
  Filled 2021-02-14: qty 2

## 2021-02-14 SURGICAL SUPPLY — 53 items
ADH SKN CLS APL DERMABOND .7 (GAUZE/BANDAGES/DRESSINGS) ×3
BAG SPEC RTRVL LRG 6X4 10 (ENDOMECHANICALS) ×3
COVER BACK TABLE 60X90IN (DRAPES) ×4 IMPLANT
COVER TIP SHEARS 8 DVNC (MISCELLANEOUS) ×3 IMPLANT
COVER TIP SHEARS 8MM DA VINCI (MISCELLANEOUS) ×4
DERMABOND ADVANCED (GAUZE/BANDAGES/DRESSINGS) ×1
DERMABOND ADVANCED .7 DNX12 (GAUZE/BANDAGES/DRESSINGS) ×3 IMPLANT
DRAPE ARM DVNC X/XI (DISPOSABLE) ×12 IMPLANT
DRAPE COLUMN DVNC XI (DISPOSABLE) ×3 IMPLANT
DRAPE DA VINCI XI ARM (DISPOSABLE) ×12
DRAPE DA VINCI XI COLUMN (DISPOSABLE) ×4
DRAPE SHEET LG 3/4 BI-LAMINATE (DRAPES) ×4 IMPLANT
DRAPE SURG IRRIG POUCH 19X23 (DRAPES) ×4 IMPLANT
ELECT REM PT RETURN 9FT ADLT (ELECTROSURGICAL) ×4
ELECTRODE REM PT RTRN 9FT ADLT (ELECTROSURGICAL) ×3 IMPLANT
GAUZE 4X4 16PLY ~~LOC~~+RFID DBL (SPONGE) ×8 IMPLANT
GLOVE SURG ENC MOIS LTX SZ6 (GLOVE) ×16 IMPLANT
GLOVE SURG ENC MOIS LTX SZ6.5 (GLOVE) ×12 IMPLANT
GLOVE SURG POLYISO LF SZ7 (GLOVE) ×4 IMPLANT
GLOVE SURG UNDER POLY LF SZ7 (GLOVE) ×8 IMPLANT
HOLDER FOLEY CATH W/STRAP (MISCELLANEOUS) ×4 IMPLANT
IRRIG SUCT STRYKERFLOW 2 WTIP (MISCELLANEOUS) ×4
IRRIGATION SUCT STRKRFLW 2 WTP (MISCELLANEOUS) ×3 IMPLANT
IV NS 1000ML (IV SOLUTION) ×4
IV NS 1000ML BAXH (IV SOLUTION) ×3 IMPLANT
KIT PROCEDURE DA VINCI SI (MISCELLANEOUS) ×4
KIT PROCEDURE DVNC SI (MISCELLANEOUS) ×3 IMPLANT
KIT TURNOVER CYSTO (KITS) ×4 IMPLANT
LEGGING LITHOTOMY PAIR STRL (DRAPES) ×4 IMPLANT
MANIFOLD NEPTUNE II (INSTRUMENTS) ×4 IMPLANT
MANIPULATOR UTERINE 4.5 ZUMI (MISCELLANEOUS) ×4 IMPLANT
NEEDLE HYPO 22GX1.5 SAFETY (NEEDLE) ×4 IMPLANT
NEEDLE SPNL 18GX3.5 QUINCKE PK (NEEDLE) ×4 IMPLANT
NS IRRIG 500ML POUR BTL (IV SOLUTION) ×4 IMPLANT
OBTURATOR OPTICAL STANDARD 8MM (TROCAR) ×4
OBTURATOR OPTICAL STND 8 DVNC (TROCAR) ×3
OBTURATOR OPTICALSTD 8 DVNC (TROCAR) ×3 IMPLANT
PACK ROBOT GYN CUSTOM WL (TRAY / TRAY PROCEDURE) ×4 IMPLANT
PACK ROBOTIC GOWN (GOWN DISPOSABLE) ×4 IMPLANT
PAD POSITIONING PINK XL (MISCELLANEOUS) ×4 IMPLANT
PORT ACCESS TROCAR AIRSEAL 12 (TROCAR) ×3 IMPLANT
PORT ACCESS TROCAR AIRSEAL 5M (TROCAR) ×1
POUCH SPECIMEN RETRIEVAL 10MM (ENDOMECHANICALS) ×4 IMPLANT
SEAL CANN UNIV 5-8 DVNC XI (MISCELLANEOUS) ×9 IMPLANT
SEAL XI 5MM-8MM UNIVERSAL (MISCELLANEOUS) ×12
SET TRI-LUMEN FLTR TB AIRSEAL (TUBING) ×4 IMPLANT
SUT STRATAFIX SPIRAL PDS+ 0 30 (SUTURE) ×4 IMPLANT
SUT VIC AB 4-0 PS2 18 (SUTURE) ×8 IMPLANT
SYR 10ML LL (SYRINGE) ×4 IMPLANT
TRAY FOL W/BAG SLVR 16FR STRL (SET/KITS/TRAYS/PACK) ×3 IMPLANT
TRAY FOLEY W/BAG SLVR 16FR LF (SET/KITS/TRAYS/PACK) ×4
TUBE CONNECTING 12X1/4 (SUCTIONS) ×4 IMPLANT
UNDERPAD 30X36 HEAVY ABSORB (UNDERPADS AND DIAPERS) ×4 IMPLANT

## 2021-02-14 NOTE — Anesthesia Procedure Notes (Signed)
Procedure Name: Intubation Date/Time: 02/14/2021 11:12 AM Performed by: Suan Halter, CRNA Pre-anesthesia Checklist: Patient identified, Emergency Drugs available, Suction available and Patient being monitored Patient Re-evaluated:Patient Re-evaluated prior to induction Oxygen Delivery Method: Circle system utilized Preoxygenation: Pre-oxygenation with 100% oxygen Induction Type: IV induction Ventilation: Mask ventilation without difficulty Laryngoscope Size: Mac and 3 Grade View: Grade II Tube type: Oral Tube size: 7.0 mm Number of attempts: 1 Airway Equipment and Method: Stylet and Oral airway Placement Confirmation: ETT inserted through vocal cords under direct vision, positive ETCO2 and breath sounds checked- equal and bilateral Secured at: 22 cm Tube secured with: Tape Dental Injury: Teeth and Oropharynx as per pre-operative assessment

## 2021-02-14 NOTE — Addendum Note (Signed)
Addendum  created 02/14/21 1626 by Suan Halter, CRNA   LDA properties accepted

## 2021-02-14 NOTE — Anesthesia Postprocedure Evaluation (Signed)
Anesthesia Post Note  Patient: Kirsten Hodges [facility]  Procedure(s) Performed: XI ROBOTIC ASSISTED TOTAL HYSTERECTOMY WITH BILATERAL SALPINGO OOPHORECTOMY SENTINEL NODE BIOPSY (Abdomen) LEFT PELVIC LYMPHADENECTOMY (Abdomen)     Patient location during evaluation: PACU Anesthesia Type: General Level of consciousness: awake and alert Pain management: pain level controlled Vital Signs Assessment: post-procedure vital signs reviewed and stable Respiratory status: spontaneous breathing, nonlabored ventilation, respiratory function stable and patient connected to nasal cannula oxygen Cardiovascular status: blood pressure returned to baseline and stable Postop Assessment: no apparent nausea or vomiting Anesthetic complications: no   No notable events documented.  Last Vitals:  Vitals:   02/14/21 1330 02/14/21 1345  BP: 117/84 (!) 149/75  Pulse: 66 70  Resp: 15 17  Temp:    SpO2: 100% 99%    Last Pain:  Vitals:   02/14/21 1345  TempSrc:   PainSc: 0-No pain                 Barnet Glasgow

## 2021-02-14 NOTE — Progress Notes (Signed)
Instructions given and time when next dose of pain med written on instruction sheet. Instructed if iunable to void within 6 hours of OR to call MD or go to ED.Understanding verbalized.

## 2021-02-14 NOTE — Op Note (Signed)
OPERATIVE NOTE 02/14/21  Surgeon: Donaciano Eva   Assistants: Joylene John NP (a provider assistant was necessary for tissue manipulation, management of robotic instrumentation, retraction and positioning due to the complexity of the case and hospital policies).   Anesthesia: General endotracheal anesthesia  ASA Class: 3   Pre-operative Diagnosis: endometrial cancer grade 1  Post-operative Diagnosis: same,   Operation: Robotic-assisted laparoscopic total hysterectomy with bilateral salpingoophorectomy, SLN biopsy, left pelvic lymphadenectomy   Surgeon: Donaciano Eva  Assistant Surgeon: Joylene John NP  Anesthesia: GET  Urine Output: 200cc  Operative Findings:  : 6cm uterus, normal appearing tubes and ovaries, normal appearing lymph nodes. Unilateral mapping to the right. Nodular cirrhotic appearing liver.   Estimated Blood Loss:   25cc       Total IV Fluids: 800 ml         Specimens: uterus, cervix, bilateral tubes and ovaries, right common iliac SLN, left pelvic lymph nodes.         Complications:  None; patient tolerated the procedure well.         Disposition: PACU - hemodynamically stable.  Procedure Details  The patient was seen in the Holding Room. The risks, benefits, complications, treatment options, and expected outcomes were discussed with the patient.  The patient concurred with the proposed plan, giving informed consent.  The site of surgery properly noted/marked. The patient was identified as Kirsten Hodges and the procedure verified as a Robotic-assisted hysterectomy with bilateral salpingo oophorectomy with SLN biopsy. A Time Out was held and the above information confirmed.  After induction of anesthesia, the patient was draped and prepped in the usual sterile manner. Pt was placed in supine position after anesthesia and draped and prepped in the usual sterile manner. The abdominal drape was placed after the CholoraPrep had been allowed to dry for  3 minutes.  Her arms were tucked to her side with all appropriate precautions.  The shoulders were stabilized with padded shoulder blocks applied to the acromium processes.  The patient was placed in the semi-lithotomy position in [place].  The perineum was prepped with Betadine. The patient was then prepped. Foley catheter was placed.  A sterile speculum was placed in the vagina.  The cervix was grasped with a single-tooth tenaculum. '2mg'$  total of ICG was injected into the cervical stroma at 2 and 9 o'clock with 1cc injected at a 1cm and 6m depth (concentration 0.'5mg'$ /ml) in all locations. The cervix was dilated with PKennon Roundsdilators.  The ZUMI uterine manipulator with a medium colpotomizer ring was placed without difficulty.  A pneum occluder balloon was placed over the manipulator.  OG tube placement was confirmed and to suction.   Next, a 5 mm skin incision was made 1 cm below the subcostal margin in the midclavicular line.  The 5 mm Optiview port and scope was used for direct entry.  Opening pressure was under 10 mm CO2.  The abdomen was insufflated and the findings were noted as above.   At this point and all points during the procedure, the patient's intra-abdominal pressure did not exceed 15 mmHg. Next, a 10 mm skin incision was made in the umbilicus and a right and left port was placed about 10 cm lateral to the robot port on the right and left side.  All ports were placed under direct visualization.  The patient was placed in steep Trendelenburg.  Bowel was folded away into the upper abdomen.  The robot was docked in the normal manner.  The  right and left peritoneum were opened parallel to the IP ligament to open the retroperitoneal spaces bilaterally. The SLN mapping was performed in bilateral pelvic basins. The para rectal and paravesical spaces were opened up entirely with careful dissection below the level of the ureters bilaterally and to the depth of the uterine artery origin in order to  skeletonize the uterine "web" and ensure visualization of all parametrial channels. The para-aortic basins were carefully exposed and evaluated for isolated para-aortic SLN's. Lymphatic channels were identified travelling to the following visualized sentinel lymph node's: unilateral mapping to the right common iliac. These SLN's were separated from their surrounding lymphatic tissue, removed and sent for permanent pathology.  Due to unilateral mapping she required left pelvic lymphadenectomy. The paravesical space was developed with monopolar and sharp dissection. It was held open with tension on the median umbilical ligament with the forth arm. The paravesical space was opened with blunt and sharp dissection to mobilize the ureter off of the medial surface of the internal iliac artery. The medial leaf of the broad ligament containing the ureter was held medially (opening the pararectal space) by the assistant's grasper. The left pelvic lymphadenectomy was performed by skeletonizing the internal iliac artery at the bifurcation with the external iliac artery. The obturator nerve was identified in the base of lateral paravesical space. The ureter was mobilized medially off of the dissection by developing the pararectal space. The genitofemoral nerve was identified, skeletonized and mobilized laterally off of the external iliac artery. An enbloc resection of lymph nodes was performed within the following boundaries: the mid portion of the common iliac proximally, the circumflex iliac vein distally, the obturator nerve posteriorally, the genitofemoral nerve laterally. The nodal basin (including obturator space) were confirmed to be empty of nodes and hemostatic. The nodes were placed in an endocatch bag and retrieved vaginally.  The hysterectomy was started after the round ligament on the right side was incised and the retroperitoneum was entered and the pararectal space was developed.  The ureter was noted to be on  the medial leaf of the broad ligament.  The peritoneum above the ureter was incised and stretched and the infundibulopelvic ligament was skeletonized, cauterized and cut.  The posterior peritoneum was taken down to the level of the KOH ring.  The anterior peritoneum was also taken down.  The bladder flap was created to the level of the KOH ring.  The uterine artery on the right side was skeletonized, cauterized and cut in the normal manner.  A similar procedure was performed on the left.  The colpotomy was made and the uterus, cervix, bilateral ovaries and tubes were amputated and delivered through the vagina.  Pedicles were inspected and excellent hemostasis was achieved.    The colpotomy at the vaginal cuff was closed with Vicryl on a CT1 needle in figure of 8's at the corners and stratafix #0 in a running 2 layer closure manner.  Irrigation was used and excellent hemostasis was achieved.  At this point in the procedure was completed.  Robotic instruments were removed under direct visulaization.  The robot was undocked. The 10 mm ports were closed with Vicryl on a UR-5 needle and the fascia was closed with 0 Vicryl on a UR-5 needle.  The skin was closed with 4-0 Vicryl in a subcuticular manner.  Dermabond was applied.  Sponge, lap and needle counts correct x 2.  The patient was taken to the recovery room in stable condition.  The vagina was swabbed with  minimal bleeding noted.   All instrument and needle counts were correct x  3.   The patient was transferred to the recovery room in a stable condition.  Donaciano Eva, MD

## 2021-02-14 NOTE — Transfer of Care (Signed)
Immediate Anesthesia Transfer of Care Note  Patient: Kirsten Hodges [facility]  Procedure(s) Performed: Procedure(s) (LRB): XI ROBOTIC ASSISTED TOTAL HYSTERECTOMY WITH BILATERAL SALPINGO OOPHORECTOMY (N/A) SENTINEL NODE BIOPSY (N/A) LEFT PELVIC LYMPHADENECTOMY  Patient Location: PACU  Anesthesia Type: General  Level of Consciousness: awake, oriented, sedated and patient cooperative  Airway & Oxygen Therapy: Patient Spontanous Breathing and Patient connected to face mask oxygen  Post-op Assessment: Report given to PACU RN and Post -op Vital signs reviewed and stable  Post vital signs: Reviewed and stable  Complications: No apparent anesthesia complications  Last Vitals:  Vitals Value Taken Time  BP 125/63 02/14/21 1255  Temp    Pulse 66 02/14/21 1300  Resp 15 02/14/21 1300  SpO2 98 % 02/14/21 1300  Vitals shown include unvalidated device data.  Last Pain:  Vitals:   02/14/21 0946  TempSrc: Oral  PainSc: 0-No pain      Patients Stated Pain Goal: 8 ([id number])  Complications: No notable events documented.

## 2021-02-14 NOTE — Interval H&P Note (Signed)
History and Physical Interval Note:  02/14/2021 10:42 AM  Kirsten Hodges  has presented today for surgery, with the diagnosis of ENDOMETRIAL CANCER.  The various methods of treatment have been discussed with the patient and family. After consideration of risks, benefits and other options for treatment, the patient has consented to  Procedure(s): XI ROBOTIC ASSISTED TOTAL HYSTERECTOMY WITH BILATERAL SALPINGO OOPHORECTOMY (N/A) SENTINEL NODE BIOPSY (N/A) as a surgical intervention.  The patient's history has been reviewed, patient examined, no change in status, stable for surgery.  I have reviewed the patient's chart and labs.  Questions were answered to the patient's satisfaction.     Thereasa Solo

## 2021-02-14 NOTE — Discharge Instructions (Addendum)
02/14/2021  Return to work: 4 weeks if applicable  Do not restart your aspirin for one week after surgery.  Activity: 1. Be up and out of the bed during the day.  Take a nap if needed.  You may walk up steps but be careful and use the hand rail.  Stair climbing will tire you more than you think, you may need to stop part way and rest.   2. No lifting or straining over 10 lbs, pushing, pulling, straining for 6 weeks.  3. No driving for 1 week(s).  Do not drive if you are taking narcotic pain medicine. You need to make sure your reaction time has returned and you can brake safely.  4. Shower daily.  Use your regular soap to bathe and when finished pat your incision dry; don't rub.  No tub baths until cleared by your surgeon.   5. No sexual activity and nothing in the vagina for 8 weeks.  6. You may experience a small amount of clear drainage from your incisions, which is normal.  If the drainage persists or increases, please call the office.  7. You may experience vaginal spotting after surgery or around the 6-8 week mark from surgery when the stitches at the top of the vagina begin to dissolve.  The spotting is normal but if you experience heavy bleeding, call our office.  8. Take Tylenol or ibuprofen first for pain and only use narcotic pain medication for severe pain not relieved by the Tylenol or Ibuprofen.  Monitor your Tylenol intake to a max of 4,000 mg.   Diet: 1. Low sodium Heart Healthy Diet is recommended.  2. It is safe to use a laxative, such as Miralax or Colace, if you have difficulty moving your bowels. You can take Sennakot at bedtime every evening to keep bowel movements regular and to prevent constipation.    Wound Care: 1. Keep clean and dry.  Shower daily.  Reasons to call the Doctor: Fever - Oral temperature greater than 100.4 degrees Fahrenheit Foul-smelling vaginal discharge Difficulty urinating Nausea and vomiting Increased pain at the site of the incision  that is unrelieved with pain medicine. Difficulty breathing with or without chest pain New calf pain especially if only on one side Sudden, continuing increased vaginal bleeding with or without clots.   Contacts: For questions or concerns you should contact:  Dr. Everitt Amber at [phone]  Joylene John, NP at [phone]  After Hours: call [phone] and have the GYN Oncologist paged/contacted     Post Anesthesia Home Care Instructions  Activity: Get plenty of rest for the remainder of the day. A responsible individual must stay with you for 24 hours following the procedure.  For the next 24 hours, DO NOT: -Drive a car -Paediatric nurse -Drink alcoholic beverages -Take any medication unless instructed by your physician -Make any legal decisions or sign important papers.  Meals: Start with liquid foods such as gelatin or soup. Progress to regular foods as tolerated. Avoid greasy, spicy, heavy foods. If nausea and/or vomiting occur, drink only clear liquids until the nausea and/or vomiting subsides. Call your physician if vomiting continues.  Special Instructions/Symptoms: Your throat may feel dry or sore from the anesthesia or the breathing tube placed in your throat during surgery. If this causes discomfort, gargle with warm salt water. The discomfort should disappear within 24 hours.  If you had a scopolamine patch placed behind your ear for the management of post- operative nausea and/or vomiting:  1. The  medication in the patch is effective for 72 hours, after which it should be removed.  Wrap patch in a tissue and discard in the trash. Wash hands thoroughly with soap and water. 2. You may remove the patch earlier than 72 hours if you experience unpleasant side effects which may include dry mouth, dizziness or visual disturbances. 3. Avoid touching the patch. Wash your hands with soap and water after contact with the patch.

## 2021-02-14 NOTE — Progress Notes (Signed)
Patient's sister called and phone message left informing her of a successful surgery, without complications and no unexpected findings. Anticipate discharge this afternoon.

## 2021-02-15 ENCOUNTER — Encounter (HOSPITAL_BASED_OUTPATIENT_CLINIC_OR_DEPARTMENT_OTHER): Payer: Self-pay | Admitting: Gynecologic Oncology

## 2021-02-15 ENCOUNTER — Telehealth: Payer: Self-pay

## 2021-02-15 NOTE — Telephone Encounter (Signed)
Spoke with Kirsten Hodges. She states she is eating, drinking and urinating well. She has not had a BM yet but is passing gas. She is taking senokot as prescribed. Instructed her to take 2 tabs bid with 8 oz of water. If no BM by Sunday mid day she can add a capful of Miralax bid. Encouraged her to drink plenty of water. She denies fever or chills. Incisions are dry and intact. Her pain is controlled with tylenol,tramadol and Ibuprofen.  Instructed to call office with any fever, chills, purulent drainage, uncontrolled pain or any other questions or concerns. Patient verbalizes understanding.  Pt aware of post op appointments as well as the office number [phone] and after hours number [phone] to call if she has any questions or concerns

## 2021-02-21 ENCOUNTER — Telehealth: Payer: Self-pay

## 2021-02-21 LAB — SURGICAL PATHOLOGY

## 2021-02-21 NOTE — Telephone Encounter (Signed)
Spoke with Alma regarding her surgical pathology from 02/14/21. Per Joylene John, NP there was no cancer left in the uterus. Biopsy must have removed it. Only see a small area of pre-cancerous changes in the lining. Lymph nodes were negative for signs of cancer. No extra treatment is needed. Patient verbalized understanding.  Patient states having weakness in her left leg. Right after surgery she reports having to lift her leg to reposition it and was unable to move it on her own. She reports numbness to the back of her thigh and sometimes her knee. She feels it has started to improve some but it feels light when she walks and does not feel grounded. She reports pain in her groin/ vaginal area when moving her leg. She denies redness, warmth or swelling to her leg. Per Joylene John, NP this could be from lymph node dissection. Instructed patient to monitor it, if it does not continue to improve or if it worsens please let us know. Instructed pt to be careful when walking to avoid falls. Patient has appointment to follow up with Dr. Denman George on 03/08/21.   Patient has our office number of [phone] if she has questions or concerns.

## 2021-03-08 ENCOUNTER — Inpatient Hospital Stay: Payer: Managed Care, Other (non HMO)

## 2021-03-08 ENCOUNTER — Encounter: Payer: Self-pay | Admitting: Gynecologic Oncology

## 2021-03-08 ENCOUNTER — Inpatient Hospital Stay: Payer: Managed Care, Other (non HMO) | Attending: Gynecologic Oncology | Admitting: Gynecologic Oncology

## 2021-03-08 ENCOUNTER — Other Ambulatory Visit: Payer: Self-pay

## 2021-03-08 VITALS — BP 143/71 | HR 102 | Temp 97.5°F | Resp 18 | Ht 60.0 in | Wt 135.4 lb

## 2021-03-08 DIAGNOSIS — Z90722 Acquired absence of ovaries, bilateral: Secondary | ICD-10-CM | POA: Insufficient documentation

## 2021-03-08 DIAGNOSIS — M25559 Pain in unspecified hip: Secondary | ICD-10-CM | POA: Diagnosis not present

## 2021-03-08 DIAGNOSIS — G5782 Other specified mononeuropathies of left lower limb: Secondary | ICD-10-CM

## 2021-03-08 DIAGNOSIS — C541 Malignant neoplasm of endometrium: Secondary | ICD-10-CM

## 2021-03-08 DIAGNOSIS — G629 Polyneuropathy, unspecified: Secondary | ICD-10-CM | POA: Insufficient documentation

## 2021-03-08 DIAGNOSIS — Z90710 Acquired absence of both cervix and uterus: Secondary | ICD-10-CM | POA: Diagnosis not present

## 2021-03-08 DIAGNOSIS — N760 Acute vaginitis: Secondary | ICD-10-CM

## 2021-03-08 DIAGNOSIS — R1084 Generalized abdominal pain: Secondary | ICD-10-CM | POA: Diagnosis not present

## 2021-03-08 DIAGNOSIS — G620 Drug-induced polyneuropathy: Secondary | ICD-10-CM

## 2021-03-08 DIAGNOSIS — Z7189 Other specified counseling: Secondary | ICD-10-CM

## 2021-03-08 LAB — CBC WITH DIFFERENTIAL (CANCER CENTER ONLY)
Abs Immature Granulocytes: 0.03 10*3/uL (ref 0.00–0.07)
Basophils Absolute: 0.1 10*3/uL (ref 0.0–0.1)
Basophils Relative: 1 %
Eosinophils Absolute: 0.2 10*3/uL (ref 0.0–0.5)
Eosinophils Relative: 2 %
HCT: 39.3 % (ref 36.0–46.0)
Hemoglobin: 13.3 g/dL (ref 12.0–15.0)
Immature Granulocytes: 0 %
Lymphocytes Relative: 37 %
Lymphs Abs: 3.5 10*3/uL (ref 0.7–4.0)
MCH: 29.2 pg (ref 26.0–34.0)
MCHC: 33.8 g/dL (ref 30.0–36.0)
MCV: 86.2 fL (ref 80.0–100.0)
Monocytes Absolute: 0.7 10*3/uL (ref 0.1–1.0)
Monocytes Relative: 8 %
Neutro Abs: 4.9 10*3/uL (ref 1.7–7.7)
Neutrophils Relative %: 52 %
Platelet Count: 324 10*3/uL (ref 150–400)
RBC: 4.56 MIL/uL (ref 3.87–5.11)
RDW: 13.2 % (ref 11.5–15.5)
WBC Count: 9.3 10*3/uL (ref 4.0–10.5)
nRBC: 0 % (ref 0.0–0.2)

## 2021-03-08 LAB — BASIC METABOLIC PANEL
Anion gap: 14 (ref 5–15)
BUN: 12 mg/dL (ref 8–23)
CO2: 22 mmol/L (ref 22–32)
Calcium: 10.4 mg/dL — ABNORMAL HIGH (ref 8.9–10.3)
Chloride: 104 mmol/L (ref 98–111)
Creatinine, Ser: 0.84 mg/dL (ref 0.44–1.00)
GFR, Estimated: >60 mL/min (ref >60)
Glucose, Bld: 112 mg/dL — ABNORMAL HIGH (ref 70–99)
Potassium: 4.1 mmol/L (ref 3.5–5.1)
Sodium: 140 mmol/L (ref 135–145)

## 2021-03-08 MED ORDER — AMOXICILLIN-POT CLAVULANATE 875-125 MG PO TABS: 1.0000 | ORAL_TABLET | Freq: Two times a day (BID) | ORAL | 0 refills | Status: DC

## 2021-03-08 NOTE — Patient Instructions (Signed)
Dr Denman George believes that the left leg weakness and numbness is from irritation of the nerve close to where the lymph nodes were removed.   She recommends that you see physical therapy for leg strengthening exercises and she has referred you for this.  Dr Denman George is suspicious that the deep pelvic pain you have is from a collection of fluid behind the vagina where the surgery was. She is ordering labs today. She is ordering a CT scan to look for a collection of fluid to see if it is large enough to be drained. She has prescribed 10 days of an antibiotic to take twice a day.   Dr Denman George recommends returning to the cancer center every 6 months for long term follow-up of your cancer (for 5 years). Please call [phone] in 4 months time to schedule a follow-up visit with either Dr Delsa Sale or Joylene John, NP for follow-up in February, 2023. Dr Denman George is leaving the [facility] in October, 2022.

## 2021-03-08 NOTE — Progress Notes (Signed)
Follow-up Note: Gyn-Onc  Consult was requested by Huey Romans for the evaluation of [facility] 60 y.o. female  CC:  Chief Complaint  Patient presents with   Endometrial adenocarcinoma [facility])    Assessment/Plan:  Kirsten Hodges  is a 60 y.o.  year old P1 with stage IA grade 1 endometrioid endometrial adenocarcinoma. MMR undetermined (low volume of tumor for eval).  Pathology revealed low risk factors for recurrence, therefore no adjuvant therapy is recommended according to NCCN guidelines.  I discussed risk for recurrence and typical symptoms encouraged her to notify us of these should they develop between visits.  I recommend she have follow-up every 6 months for 5 years in accordance with NCCN guidelines. Those visits should include symptom assessment, physical exam and pelvic examination. Pap smears are not indicated or recommended in the routine surveillance of endometrial cancer. She will follow-up alternating with Burman Riis and either Joylene John, NP or Dr Delsa Sale.  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  While it is improving with time, I recommend evaluation and treatment with physical therapy.  With respect to pelvic pain with defecation, I am recommending CT imaging of the abdomen and pelvis to evaluate for an occult fluid collection.  She has clinical evidence of a cuff cellulitis and this may be communicating with a larger collection.  If 1 is present she would be a candidate for consideration of IR drainage.  I explained to the patient that sometimes collections are too small to be amenable to intervention, in which case they typically spontaneously resolve.  I have prescribed 10 days of Augmentin empirically for presumed cuff cellulitis.  She was counseled regarding symptoms to return to the clinic such as fever worsening pain and dysfunction.  HPI: Kirsten Hodges is a 60 year old P1 who was seen in consultation at the request of Burman Riis, NP, for evaluation and treatment of grade 1 endometrial cancer.   Her symptoms began in 01/04/21 with postmenopausal bleeding.  She saw Burman Riis, an NP with [facility], for a new patient visit on 01/04/21.  Work-up of symptoms included an endometrial biopsy and [place]. Transvaginal US on 01/10/21 showed a uterus measuring 7.3 x 5 x 4.3 cm with a thin endometrium of 0.3 cm. Endometrial sampling with an endometrial Pipelle was performed on 01/04/2021 and showed FIGO grade 1 endometrioid endometrial adenocarcinoma.   Preoperative liver function tests were mildly elevated but with normal coags.   Interval Hx:  On 02/14/2021 she underwent robotic assisted total laparoscopic hysterectomy with bilateral salpingo-oophorectomy, sentinel lymph node biopsy (unilateral on the left) and left pelvic complete lymphadenectomy.  Intraoperative findings were significant for unilateral sentinel lymph node mapping on the right, and normal-appearing 6 cm uterus, no gross apparent extra uterine disease.  The patient's liver appeared cirrhotic and nodular. Surgery was uncomplicated though there was significant oozing from the surgical bed with clinical mild coagulopathy.  Final pathology revealed no residual carcinoma with only focal complex atypical hyperplasia present, no myometrial invasion LVSI or involvement of the cervix adnexa or lymph nodes.  MMR was not able to be assessed due to the paucity of cancer from the original biopsy.  She was determined to have low risk disease, stage Ia grade 1 endometrioid endometrial adenocarcinoma.  No adjuvant therapy was recommended in accordance with NCCN guidelines.  Since surgery she developed left adductor muscle weakness.  This was apparent immediately  postoperatively and progressively became slightly better over subsequent weeks however at the time of her postoperative visit she  continued to have some weakness with left-sided hip flexion and hip adduction.  Additionally she had some numbness in the lateral thigh of the left side.  She had left-sided pelvic pain and pelvic pain with defecation.  She felt a sensation of pressure in the pelvis.  She reported mild vaginal bleeding.   Current Meds:  Outpatient Encounter Medications as of 03/08/2021  Medication Sig   amoxicillin-clavulanate (AUGMENTIN) 875-125 MG tablet Take 1 tablet by mouth 2 (two) times daily.   Azelastine-Fluticasone (DYMISTA) 137-50 MCG/ACT SUSP 1 spray each nostril 2 times per day   cetirizine (ZYRTEC) 10 MG tablet Take 10 mg by mouth at bedtime.   ezetimibe (ZETIA) 10 MG tablet Take 1 tablet (10 mg total) by mouth daily. (Patient taking differently: Take 10 mg by mouth at bedtime.)   famotidine (PEPCID) 20 MG tablet Take 20 mg by mouth daily as needed for heartburn or indigestion.   glimepiride (AMARYL) 4 MG tablet Take 4 mg by mouth every morning.   ibuprofen (ADVIL) 800 MG tablet Take 1 tablet (800 mg total) by mouth every 8 (eight) hours as needed for moderate pain. For AFTER surgery only   JANUMET 50-1000 MG tablet Take 1 tablet by mouth 2 (two) times daily with a meal.   losartan (COZAAR) 25 MG tablet Take 25 mg by mouth daily.   mometasone (ELOCON) 0.1 % ointment Can apply to eczema 2 times a day until resolved   montelukast (SINGULAIR) 10 MG tablet Take one tablet by mouth once daily. (Patient taking differently: at bedtime.)   Multiple Vitamins-Minerals (MULTIVITAMIN WITH MINERALS) tablet Take 1 tablet by mouth daily.   rosuvastatin (CRESTOR) 10 MG tablet Take 1 tablet (10 mg total) by mouth daily. (Patient taking differently: Take 10 mg by mouth at bedtime.)   senna-docusate (SENOKOT-S) 8.6-50 MG tablet Take 2 tablets by mouth at bedtime. For AFTER surgery, do not take if having diarrhea   vitamin E 180 MG (400 UNITS) capsule Take 400 Units by mouth daily.   traMADol (ULTRAM) 50 MG tablet Take  1 tablet (50 mg total) by mouth every 6 (six) hours as needed for severe pain. For AFTER surgery only, do not take and drive (Patient not taking: Reported on 03/08/2021)   No facility-administered encounter medications on file as of 03/08/2021.    Allergy:  Allergies  Allergen Reactions   Atorvastatin Other (See Comments)    Muscle aches Muscle aches   Colesevelam Hcl Other (See Comments)    Worsened LFTs with welchol   Enalapril Other (See Comments)    cough    Simvastatin Rash         Social Hx:   Social History   Socioeconomic History   Marital status: Single    Spouse name: Not on file   Number of children: Not on file   Years of education: Not on file   Highest education level: Not on file  Occupational History   Not on file  Tobacco Use   Smoking status: Never   Smokeless tobacco: Never  Vaping Use   Vaping Use: Never used  Substance and Sexual Activity   Alcohol use: Yes    Comment: occasionally   Drug use: No   Sexual activity: Not Currently  Other Topics Concern   Not on file  Social History Narrative   Not on file   Social Determinants of Health  Financial Resource Strain: Not on file  Food Insecurity: Not on file  Transportation Needs: Not on file  Physical Activity: Not on file  Stress: Not on file  Social Connections: Not on file  Intimate Partner Violence: Not on file    Past Surgical Hx:  Past Surgical History:  Procedure Laterality Date   BREAST BIOPSY Right 2022   benign   BREAST LUMPECTOMY Left 1982   benign   colonscopy     LYMPHADENECTOMY  02/14/2021   Procedure: LEFT PELVIC LYMPHADENECTOMY;  Surgeon: Everitt Amber, MD;  Location: [place];  Service: Gynecology;;   ROBOTIC ASSISTED TOTAL HYSTERECTOMY WITH BILATERAL SALPINGO OOPHERECTOMY N/A 02/14/2021   Procedure: XI ROBOTIC ASSISTED TOTAL HYSTERECTOMY WITH BILATERAL SALPINGO OOPHORECTOMY;  Surgeon: Everitt Amber, MD;  Location: [place];  Service:  Gynecology;  Laterality: N/A;   SENTINEL NODE BIOPSY N/A 02/14/2021   Procedure: SENTINEL NODE BIOPSY;  Surgeon: Everitt Amber, MD;  Location: [facility];  Service: Gynecology;  Laterality: N/A;    Past Medical Hx:  Past Medical History:  Diagnosis Date   Allergic rhinitis    COVID 05/17/2020   took infusion of sotrovimab on 05-21-2020   dm type 2    Eczema    Elevated liver function tests    endometrial cancer    Fibrocystic breast disease    GERD (gastroesophageal reflux disease)    Hypercholesteremia    Hyperlipidemia    Hypertension    Microalbuminuria     Past Gynecological History: SVD x1 No LMP recorded. Patient is postmenopausal.  Family Hx:  Family History  Problem Relation Age of Onset   Hypertension Mother    Hyperlipidemia Father    Thyroid cancer Father    Hyperlipidemia Brother    Stroke Brother    Breast cancer Neg Hx    Colon cancer Neg Hx    Endometrial cancer Neg Hx    Ovarian cancer Neg Hx    Pancreatic cancer Neg Hx    Prostate cancer Neg Hx     Review of Systems:  Constitutional  Feels well,    ENT Normal appearing ears and nares bilaterally Skin/Breast  No rash, sores, jaundice, itching, dryness Cardiovascular  No chest pain, shortness of breath, or edema  Pulmonary  No cough or wheeze.  Gastro Intestinal  No nausea, vomitting, or diarrhoea. No bright red blood per rectum, no abdominal pain, change in bowel movement, or constipation. + pelvic pain with defecation.  Genito Urinary  No frequency, urgency, dysuria, minimal bleeding, + left pelvic pain Musculo Skeletal  No myalgia, arthralgia, joint swelling or pain  Neurologic  + left thigh weakness in hip flexion and adduction.  Psychology  No depression, anxiety, insomnia.   Vitals:  Blood pressure (!) 143/71, pulse (!) 102, temperature (!) 97.5 F (36.4 C), temperature source Oral, resp. rate 18, height 5' (1.524 m), weight 135 lb 6.4 oz (61.4 kg), SpO2 98  %.  Physical Exam: WD in NAD Neck  Supple NROM, without any enlargements.  Lymph Node Survey No cervical supraclavicular or inguinal adenopathy Skin  No rash/lesions/breakdown  Psychiatry  Alert and oriented to person, place, and time  Abdomen  Normoactive bowel sounds, abdomen soft, non-tender and mildly obese without evidence of hernia. Well healed incisions with no evidence of cellulitis.  Back No CVA tenderness Genito Urinary  Vulva/vagina: the vaginal cuff is erythematous and tender. With pressure from the speculum, a small amount of purulent fluid emits from the right  vaginal fornix. There was no palpable rigidity or mass or collection on bimanual exam.  Rectal  deferred Extremities  No bilateral cyanosis, clubbing or edema. 2/3 weakness of left adductor muscles.    30 minutes of direct face to face counseling time was spent with the patient. This included discussion about prognosis, therapy recommendations and postoperative side effects and are beyond the scope of routine postoperative care.  Thereasa Solo, MD  03/08/2021, 5:24 PM

## 2021-03-14 ENCOUNTER — Telehealth: Payer: Self-pay | Admitting: *Deleted

## 2021-03-14 NOTE — Telephone Encounter (Signed)
Called the patient regarding the request from [facility] to fax CT order to [facility]. Per patient request canceled CT at [facility] and fax order To [facility] at [phone]. Patient also request a copy of her op note, help patient navigate my chart and get access to her report. Patient to call the office once the CT is scheduled.

## 2021-03-19 ENCOUNTER — Ambulatory Visit (HOSPITAL_COMMUNITY): Payer: Managed Care, Other (non HMO)

## 2021-03-20 ENCOUNTER — Telehealth: Payer: Self-pay | Admitting: *Deleted

## 2021-03-20 NOTE — Telephone Encounter (Signed)
I spoke with Kirsten Hodges regarding her CT scan results. 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 She has no further questions or concerns at this time.

## 2021-03-26 ENCOUNTER — Ambulatory Visit
Admission: RE | Admit: 2021-03-26 | Discharge: 2021-03-26 | Disposition: A | Discharge status: Home / Self Care | Payer: Self-pay | Source: Ambulatory Visit | Attending: Gynecologic Oncology | Admitting: Gynecologic Oncology

## 2021-03-26 ENCOUNTER — Encounter: Payer: Self-pay | Admitting: Oncology

## 2021-03-26 DIAGNOSIS — C541 Malignant neoplasm of endometrium: Secondary | ICD-10-CM

## 2021-04-02 ENCOUNTER — Telehealth: Payer: Self-pay

## 2021-04-02 ENCOUNTER — Other Ambulatory Visit: Payer: Self-pay

## 2021-04-02 ENCOUNTER — Inpatient Hospital Stay: Payer: Managed Care, Other (non HMO) | Attending: Gynecologic Oncology | Admitting: Gynecologic Oncology

## 2021-04-02 ENCOUNTER — Inpatient Hospital Stay: Payer: Managed Care, Other (non HMO)

## 2021-04-02 VITALS — BP 151/69 | HR 97 | Temp 98.3°F | Resp 18 | Wt 139.6 lb

## 2021-04-02 DIAGNOSIS — C541 Malignant neoplasm of endometrium: Secondary | ICD-10-CM | POA: Diagnosis present

## 2021-04-02 DIAGNOSIS — Z7982 Long term (current) use of aspirin: Secondary | ICD-10-CM | POA: Diagnosis not present

## 2021-04-02 DIAGNOSIS — N939 Abnormal uterine and vaginal bleeding, unspecified: Secondary | ICD-10-CM

## 2021-04-02 DIAGNOSIS — Z9889 Other specified postprocedural states: Secondary | ICD-10-CM

## 2021-04-02 LAB — COMPREHENSIVE METABOLIC PANEL
ALT: 93 U/L — ABNORMAL HIGH (ref 0–44)
AST: 64 U/L — ABNORMAL HIGH (ref 15–41)
Albumin: 4.7 g/dL (ref 3.5–5.0)
Alkaline Phosphatase: 118 U/L (ref 38–126)
Anion gap: 13 (ref 5–15)
BUN: 10 mg/dL (ref 8–23)
CO2: 25 mmol/L (ref 22–32)
Calcium: 10.3 mg/dL (ref 8.9–10.3)
Chloride: 106 mmol/L (ref 98–111)
Creatinine, Ser: 0.77 mg/dL (ref 0.44–1.00)
GFR, Estimated: >60 mL/min (ref >60)
Glucose, Bld: 102 mg/dL — ABNORMAL HIGH (ref 70–99)
Potassium: 4.1 mmol/L (ref 3.5–5.1)
Sodium: 144 mmol/L (ref 135–145)
Total Bilirubin: 0.5 mg/dL (ref 0.3–1.2)
Total Protein: 8.3 g/dL — ABNORMAL HIGH (ref 6.5–8.1)

## 2021-04-02 LAB — CBC (CANCER CENTER ONLY)
HCT: 38.4 % (ref 36.0–46.0)
Hemoglobin: 12.4 g/dL (ref 12.0–15.0)
MCH: 28.8 pg (ref 26.0–34.0)
MCHC: 32.3 g/dL (ref 30.0–36.0)
MCV: 89.3 fL (ref 80.0–100.0)
Platelet Count: 249 10*3/uL (ref 150–400)
RBC: 4.3 MIL/uL (ref 3.87–5.11)
RDW: 13.4 % (ref 11.5–15.5)
WBC Count: 8.1 10*3/uL (ref 4.0–10.5)
nRBC: 0 % (ref 0.0–0.2)

## 2021-04-02 LAB — PROTIME-INR
INR: 1 (ref 0.8–1.2)
Prothrombin Time: 13.3 seconds (ref 11.4–15.2)

## 2021-04-02 LAB — APTT: aPTT: 28 seconds (ref 24–36)

## 2021-04-02 NOTE — Telephone Encounter (Signed)
Received call from [facility], she reports vaginal bleeding starting last night at 9pm. While she was getting ready for bed, she noticed blood in her underwear. She soaked thru 2 pads overnight. She reports passing clots 2-3 inches in size. She states she feels pressure and cramping in her lower abdomen that started last night as well. Patient denies any heavy lifting or intercourse prior to bleeding. She states she Vacuumed part of the swimming pool on Sunday but had her niece do the rest as she didn't want to overdo it. Pt. Reports bleeding has improved and is spotting now, she still has the feeling of pressure in her low abdomen.   Joylene John, NP notified. Pt. To be seen in clinic today. Appointment scheduled for 2:30pm. Patient verbalizes understanding and is in agreement of date and time. Instructed to call with any questions or concerns.

## 2021-04-02 NOTE — Patient Instructions (Signed)
We will check lab work today and call you with the results. If the bleeding returns for more than 24 hours, please call the office. Stop taking your aspirin now.

## 2021-04-03 ENCOUNTER — Telehealth: Payer: Self-pay

## 2021-04-03 NOTE — Telephone Encounter (Signed)
Spoke with [place] regarding her lab results from 04/02/21.Per Joylene John, NP liver enzymes have slightly increased. Patient states her PCP has been following this, results will be sent to Dr. Lysle Rubens. Instructed to avoid aspirin, alcohol and tylenol. Patient reports very light vaginal spotting. Instructed patient to follow up with Dr. Lysle Rubens regarding her liver enzymes and to notify our office if her vaginal bleeding returns. Patient verbalized understanding and will call with questions or concerns.

## 2021-04-03 NOTE — Telephone Encounter (Signed)
Left message requesting return call. Checking in to see how patient is feeling and review her labs.

## 2021-04-04 NOTE — Progress Notes (Signed)
GYN Oncology Symptom Management  Kirsten Hodges is a 60 year old female with stage IA grade 1 endometrioid endometrial adenocarcinoma s/p surgery on 02/14/2021. She was last seen in the office on 03/08/2021 for her post-op check and was started on antibiotics for cuff cellulitis with a CT ordered as well to rule out a pelvic fluid collection. The CT scan on 03/19/2021 at [facility] was unremarkable except for mild arthritic changes.   She presents to the office today for evaluation of heavy vaginal bleeding. She states she was sitting up in the evening around 9 pm and felt gushes of fluid. When she went to the bathroom, she had large clots in the toilet. She noted the clots (measuring around 2-3 inches) in the toilet on 2 separate episodes. She states she went to bed and placed two towels along with a waterproof pad underneath on her bed. She woke up in the middle of the night with blood mixed with serous drainage soaking through the towels and on the pad. She also soaked through 2 peri pads. During this time, she reported having lower abdominal cramping. She denies putting anything in her vagina including intercourse and denies lifting anything heavy. This morning, the bleeding had improved with light spotting reported. No fever or chills reported. Bowels and bladder functioning without difficulty.    She states she started taking aspirin 81 mg when she returned to work recently but does not take it for a cardiac reason. She also has seen her PCP recently who told her she had fatty liver.  Exam: Alert, oriented, in no acute distress. Dr. Denman George to the room to perform examination. Vaginal cuff intact with no evidence of dehiscence. Areas of mild bleeding noted along the vaginal cuff. Silver nitrate applied to these areas. Pt tolerated well.   Assessment/Plan: Bleeding from the vaginal cuff post-operatively. Patient is advised to stop taking the aspirin now and to not resume given her liver dysfunction. Plan to  check coags today along with a CBC and Cmet to evaluate liver function per Dr. Denman George. No evidence of dehiscence or infection on examination today. Patient is advised to monitor the bleeding and to call if her bleeding becomes heavy again and/or persists for over 24 hours. Our office will contact her with the results of her lab work from today. All questions answered and she is advised to call for any needs or concerns.

## 2021-04-06 ENCOUNTER — Other Ambulatory Visit: Payer: Self-pay | Admitting: Allergy and Immunology

## 2021-07-10 ENCOUNTER — Encounter: Payer: Self-pay | Admitting: Gynecologic Oncology

## 2021-07-17 ENCOUNTER — Telehealth: Payer: Self-pay | Admitting: *Deleted

## 2021-07-17 NOTE — Telephone Encounter (Signed)
Per patient request, scheduled patient for a follow up appt on 1/19

## 2021-07-24 ENCOUNTER — Telehealth: Payer: Self-pay | Admitting: *Deleted

## 2021-07-24 NOTE — Progress Notes (Deleted)
Follow Up Note: Gyn-Onc  Emmaline Life 61 y.o. female  CC: She presents for a f/u visit   HPI: The oncology history was reviewed.  Interval History:  At the postop visit she c/o weakness when closing(adducting) LE. A referral was placed for PT. She was also c/o painful BM and treated for a cuff cellulitis. A CT scan was ordered and failed to show a postop pelvic collection.  Review of Systems  Review of Systems  Constitutional:  Negative for malaise/fatigue and weight loss.  Respiratory:  Negative for shortness of breath and wheezing.   Cardiovascular:  Negative for chest pain and leg swelling.  Gastrointestinal:  Negative for abdominal pain, blood in stool, constipation, nausea and vomiting.  Genitourinary:  Negative for dysuria, frequency, hematuria and urgency.  Musculoskeletal:  Negative for joint pain and myalgias.  Neurological:  Negative for weakness.  Psychiatric/Behavioral:  Negative for depression. The patient does not have insomnia.    Current medications, allergy, social history, past surgical history, past medical history, family history were all reviewed.    Vitals:  There were no vitals taken for this visit.  Physical Exam:  Physical Exam Exam conducted with a chaperone present.  Constitutional:      General: She is not in acute distress. Cardiovascular:     Rate and Rhythm: Normal rate and regular rhythm.  Pulmonary:     Effort: Pulmonary effort is normal.     Breath sounds: Normal breath sounds. No wheezing or rhonchi.  Abdominal:     Palpations: Abdomen is soft.     Tenderness: There is no abdominal tenderness. There is no right CVA tenderness or left CVA tenderness.     Hernia: No hernia is present.  Genitourinary:    General: Normal vulva.     Urethra: No urethral lesion.     Vagina: No lesions. No bleeding Musculoskeletal:     Cervical back: Neck supple.     Right lower leg: No edema.     Left lower leg: No edema.  Lymphadenopathy:      Upper Body:     Right upper body: No supraclavicular adenopathy.     Left upper body: No supraclavicular adenopathy.     Lower Body: No right inguinal adenopathy. No left inguinal adenopathy.  Skin:    Findings: No rash.  Neurological:     Mental Status: She is oriented to person, place, and time.   Assessment/Plan: ***   [name], MD

## 2021-07-24 NOTE — Assessment & Plan Note (Deleted)
Kirsten Hodges [name]  is a 61 y.o.  year old P81 with stage IA grade 1 endometrioid endometrial adenocarcinoma. MMR undetermined (low volume of tumor for eval). Pathology revealed low risk factors for recurrence Negative symptom review, normal exam.  No evidence of recurrence   >recommend she have follow-up every 6 months for 5 years, alternating w/referring gynecologist in accordance with NCCN guidelines.

## 2021-07-24 NOTE — Telephone Encounter (Signed)
Appt rescheduled from 1/19 to 1/12

## 2021-07-25 ENCOUNTER — Ambulatory Visit: Payer: Managed Care, Other (non HMO) | Admitting: Obstetrics & Gynecology

## 2021-07-25 ENCOUNTER — Inpatient Hospital Stay: Payer: Managed Care, Other (non HMO) | Attending: Obstetrics & Gynecology | Admitting: Obstetrics & Gynecology

## 2021-07-25 ENCOUNTER — Other Ambulatory Visit: Payer: Self-pay

## 2021-07-25 ENCOUNTER — Encounter: Payer: Self-pay | Admitting: Obstetrics & Gynecology

## 2021-07-25 VITALS — BP 157/60 | HR 85 | Temp 97.5°F | Resp 16 | Ht 60.0 in | Wt 144.2 lb

## 2021-07-25 DIAGNOSIS — C541 Malignant neoplasm of endometrium: Secondary | ICD-10-CM

## 2021-07-25 DIAGNOSIS — Z8542 Personal history of malignant neoplasm of other parts of uterus: Secondary | ICD-10-CM | POA: Insufficient documentation

## 2021-07-25 NOTE — Patient Instructions (Signed)
Return in 1 yr 

## 2021-07-25 NOTE — Progress Notes (Signed)
Follow Up Note: Gyn-Onc  Kirsten Hodges 61 y.o. female  CC: She presents for a f/u visit   HPI: The oncology history was reviewed.  Interval History: Less LE weakness; no further painful BM.  She denies any vaginal bleeding, abdominal/pelvic pain, cough, lethargy or increasing abdominal girth.   Review of Systems  Review of Systems  Constitutional:  Negative for malaise/fatigue and weight loss.  Respiratory:  Negative for shortness of breath and wheezing.   Cardiovascular:  Negative for chest pain and leg swelling.  Gastrointestinal:  Negative for abdominal pain, blood in stool, constipation, nausea and vomiting.  Genitourinary:  Negative for dysuria, frequency, hematuria and urgency.  Musculoskeletal:  Negative for joint pain and myalgias.  Neurological:  Negative for weakness.  Psychiatric/Behavioral:  Negative for depression. The patient does not have insomnia.    Current medications, allergy, social history, past surgical history, past medical history, family history were all reviewed.    Vitals:  Blood pressure (!) 157/60, pulse 85, temperature (!) 97.5 F (36.4 C), temperature source Tympanic, resp. rate 16, height 5' (1.524 m), weight 144 lb 3.2 oz (65.4 kg), SpO2 95 %.  Physical Exam:  Physical Exam Exam conducted with a chaperone present.  Constitutional:      General: She is not in acute distress. Cardiovascular:     Rate and Rhythm: Normal rate and regular rhythm.  Pulmonary:     Effort: Pulmonary effort is normal.     Breath sounds: Normal breath sounds. No wheezing or rhonchi.  Abdominal:     Palpations: Abdomen is soft.     Tenderness: There is no abdominal tenderness. There is no right CVA tenderness or left CVA tenderness.     Hernia: No hernia is present.  Genitourinary:    General: Normal vulva.     Urethra: No urethral lesion.     Vagina: No lesions. No bleeding Musculoskeletal:     Cervical back: Neck supple.     Right lower leg: No  edema.     Left lower leg: No edema. Small erythema at the apex.  Thick yellow discharge.  No palpable abnormality. Lymphadenopathy:     Upper Body:     Right upper body: No supraclavicular adenopathy.     Left upper body: No supraclavicular adenopathy.     Lower Body: No right inguinal adenopathy. No left inguinal adenopathy.  Skin:    Findings: No rash.  Neurological:     Mental Status: She is oriented to person, place, and time.   Assessment/Plan: Endometrial cancer (Kirsten Hodges) H/O stage IA gr 1 EC, MMR undetermined Negative symptom review, exam c/w atrophic vaginitis.  No evidence of recurrence  >continue f/u q 6 mos x 5 yrs, alternating w/referring gynecologist in accordance w/NCCN guidelines    Kirsten Crocker, MD

## 2021-07-25 NOTE — Assessment & Plan Note (Signed)
H/O stage IA gr 1 EC, MMR undetermined Negative symptom review, exam c/w atrophic vaginitis.  No evidence of recurrence  >continue f/u q 6 mos x 5 yrs, alternating w/referring gynecologist in accordance w/NCCN guidelines

## 2021-08-01 ENCOUNTER — Inpatient Hospital Stay: Payer: Managed Care, Other (non HMO) | Admitting: Obstetrics & Gynecology

## 2021-10-01 ENCOUNTER — Other Ambulatory Visit: Payer: Self-pay | Admitting: Allergy and Immunology

## 2022-01-27 ENCOUNTER — Ambulatory Visit: Payer: Managed Care, Other (non HMO) | Admitting: Allergy and Immunology

## 2022-08-05 NOTE — Assessment & Plan Note (Signed)
H/O stage IA gr 1 EC, MMR undetermined Negative symptom review.  No evidence of recurrence   >continue f/u q 6 mos x 5 yrs, alternating w/referring gynecologist in accordance w/NCCN guidelines

## 2022-08-05 NOTE — Progress Notes (Unsigned)
Follow Up Note: Gyn-Onc  Kirsten Hodges 62 y.o. female  CC: She presents for a f/u visit   HPI: The oncology history was reviewed.  Interval History: Less LE weakness; no further painful BM.  She denies any vaginal bleeding, abdominal/pelvic pain, cough, lethargy or increasing abdominal girth.   Review of Systems  Review of Systems  Constitutional:  Negative for malaise/fatigue and weight loss.  Respiratory:  Negative for shortness of breath and wheezing.   Cardiovascular:  Negative for chest pain and leg swelling.  Gastrointestinal:  Negative for abdominal pain, blood in stool, constipation, nausea and vomiting.  Genitourinary:  Negative for dysuria, frequency, hematuria and urgency.  Musculoskeletal:  Negative for joint pain and myalgias.  Neurological:  Negative for weakness.  Psychiatric/Behavioral:  Negative for depression. The patient does not have insomnia.    Current medications, allergy, social history, past surgical history, past medical history, family history were all reviewed.    Vitals:  There were no vitals taken for this visit.  Physical Exam:  Physical Exam Exam conducted with a chaperone present.  Constitutional:      General: She is not in acute distress. Cardiovascular:     Rate and Rhythm: Normal rate and regular rhythm.  Pulmonary:     Effort: Pulmonary effort is normal.     Breath sounds: Normal breath sounds. No wheezing or rhonchi.  Abdominal:     Palpations: Abdomen is soft.     Tenderness: There is no abdominal tenderness. There is no right CVA tenderness or left CVA tenderness.     Hernia: No hernia is present.  Genitourinary:    General: Normal vulva.     Urethra: No urethral lesion.     Vagina: No lesions. No bleeding Musculoskeletal:     Cervical back: Neck supple.     Right lower leg: No edema.     Left lower leg: No edema. Small erythema at the apex.  Thick yellow discharge.  No palpable abnormality. Lymphadenopathy:      Upper Body:     Right upper body: No supraclavicular adenopathy.     Left upper body: No supraclavicular adenopathy.     Lower Body: No right inguinal adenopathy. No left inguinal adenopathy.  Skin:    Findings: No rash.  Neurological:     Mental Status: She is oriented to person, place, and time.   Assessment/Plan: No problem-specific Assessment & Plan notes found for this encounter.     [name], MD

## 2022-08-06 ENCOUNTER — Inpatient Hospital Stay: Payer: BC Managed Care – PPO | Attending: Obstetrics & Gynecology | Admitting: Obstetrics & Gynecology

## 2022-08-06 ENCOUNTER — Encounter: Payer: Self-pay | Admitting: Obstetrics & Gynecology

## 2022-08-06 ENCOUNTER — Other Ambulatory Visit: Payer: Self-pay

## 2022-08-06 VITALS — BP 143/68 | HR 87 | Temp 98.5°F | Resp 20 | Wt 141.1 lb

## 2022-08-06 DIAGNOSIS — Z8542 Personal history of malignant neoplasm of other parts of uterus: Secondary | ICD-10-CM | POA: Diagnosis not present

## 2022-08-06 DIAGNOSIS — C541 Malignant neoplasm of endometrium: Secondary | ICD-10-CM

## 2022-08-06 NOTE — Patient Instructions (Addendum)
Return in 1 year. Please call the office at [phone] closer to January 2025 to schedule your appointment.

## 2022-09-11 ENCOUNTER — Telehealth: Payer: Self-pay

## 2022-09-11 NOTE — Telephone Encounter (Signed)
Pt called office stating she was having trouble printing her last office note with Dr.Jackson-Moore in January. She asked if our office could email the last office note and her AVS to the email on file.   Records emailed as requested

## 2023-02-21 ENCOUNTER — Ambulatory Visit
Admission: EM | Admit: 2023-02-21 | Discharge: 2023-02-21 | Disposition: A | Discharge status: Home / Self Care | Payer: BC Managed Care – PPO | Source: Home / Self Care

## 2023-02-21 DIAGNOSIS — U071 COVID-19: Secondary | ICD-10-CM | POA: Diagnosis present

## 2023-02-21 MED ORDER — BENZONATATE 100 MG PO CAPS: 100.0000 mg | ORAL_CAPSULE | Freq: Three times a day (TID) | ORAL | 0 refills | Status: DC | PRN

## 2023-02-21 NOTE — ED Provider Notes (Signed)
[facility]    CSN: [id number] Arrival date & time: 02/21/23  4401      History   Chief Complaint Chief Complaint  Patient presents with   COVID test    HPI Kirsten Hodges is a 62 y.o. female.   Patient presents for further evaluation after testing positive for COVID 6 days ago with an at home test.  States that it was an expired test so she needs retesting for work.  Patient states that symptoms started approximately 8 days ago and included nasal congestion, itchy throat, cough.  Reports that she feels much better and is only left with an itchy throat and a nonproductive cough.  Denies any fever, chest pain, shortness of breath.  Denies history of asthma or COPD.  She has taken Mucinex and Tylenol for symptoms.     Past Medical History:  Diagnosis Date   Allergic rhinitis    COVID 05/17/2020   took infusion of sotrovimab on 05-21-2020   dm type 2    Eczema    Elevated liver function tests    endometrial cancer    Fibrocystic breast disease    GERD (gastroesophageal reflux disease)    Hypercholesteremia    Hyperlipidemia    Hypertension    Microalbuminuria     Patient Active Problem List   Diagnosis Date Noted   Endometrial cancer (HCC) 01/23/2021   Diabetes mellitus without complication (HCC)    Hypertension    Microalbuminuria    Hypercholesteremia    Fibrocystic breast disease    Hyperlipidemia 07/05/2013    Past Surgical History:  Procedure Laterality Date   BREAST BIOPSY Right 2022   benign   BREAST LUMPECTOMY Left 1982   benign   colonscopy     LYMPHADENECTOMY  02/14/2021   Procedure: LEFT PELVIC LYMPHADENECTOMY;  Surgeon: Adolphus Birchwood, MD;  Location: [facility] ;  Service: Gynecology;;   ROBOTIC ASSISTED TOTAL HYSTERECTOMY WITH BILATERAL SALPINGO OOPHERECTOMY N/A 02/14/2021   Procedure: XI ROBOTIC ASSISTED TOTAL HYSTERECTOMY WITH BILATERAL SALPINGO OOPHORECTOMY;  Surgeon: Adolphus Birchwood, MD;  [facility];  Service: Gynecology;  Laterality: N/A;   SENTINEL NODE BIOPSY N/A 02/14/2021   Procedure: SENTINEL NODE BIOPSY;  Surgeon: Adolphus Birchwood, MD;  Location: [facility];  Service: Gynecology;  Laterality: N/A;    OB History   No obstetric history on file.      Home Medications    Prior to Admission medications   Medication Sig Start Date End Date Taking? Authorizing Provider  Azelastine-Fluticasone [facility]) 137-50 MCG/ACT SUSP 1 spray each nostril 2 times per day 01/28/21  Yes Kozlow, Alvira Philips, MD  benzonatate (TESSALON) 100 MG capsule Take 1 capsule (100 mg total) by mouth every 8 (eight) hours as needed for cough. 02/21/23  Yes Dellene Mcgroarty, Rolly Salter E, FNP  cetirizine (ZYRTEC) 10 MG tablet Take 10 mg by mouth at bedtime.   Yes [provider]  co-enzyme Q-10 30 MG capsule Take 30 mg by mouth 3 (three) times daily.   Yes [provider]  dapagliflozin propanediol (FARXIGA) 10 MG TABS tablet Take by mouth daily.   Yes [provider]  ezetimibe (ZETIA) 10 MG tablet Take 1 tablet (10 mg total) by mouth daily. Patient taking differently: Take 10 mg by mouth at bedtime. 10/03/13  Yes Jake Bathe, MD  famotidine (PEPCID) 20 MG tablet Take 20 mg by mouth daily as needed for heartburn or indigestion.   Yes [provider]  glimepiride (AMARYL) 4 MG tablet Take 4 mg by mouth every morning. 10/18/20  Yes [provider]  hydrochlorothiazide (HYDRODIURIL) 12.5 MG tablet Take 12.5 mg by mouth daily.   Yes [provider]  losartan (COZAAR) 25 MG tablet Take 25 mg by mouth daily.   Yes [provider]  metformin (FORTAMET) 1000 MG (OSM) 24 hr tablet Take 1,000 mg by mouth 2 (two) times daily with a meal.   Yes [provider]  mometasone (ELOCON) 0.1 % ointment Can apply to eczema 2 times a day until resolved 02/08/21  Yes Kozlow, Alvira Philips, MD  montelukast (SINGULAIR) 10 MG tablet Take one tablet by mouth once daily.  10/01/21  Yes Kozlow, Alvira Philips, MD  Multiple Vitamins-Minerals (MULTIVITAMIN WITH MINERALS) tablet Take 1 tablet by mouth daily.   Yes [provider]  omeprazole (PRILOSEC OTC) 20 MG tablet 1 tablet   Yes [provider]  rosuvastatin (CRESTOR) 10 MG tablet Take 1 tablet (10 mg total) by mouth daily. Patient taking differently: Take 10 mg by mouth at bedtime. 10/03/13  Yes Jake Bathe, MD  vitamin E 180 MG (400 UNITS) capsule Take 400 Units by mouth daily.   Yes [provider]  zinc gluconate 50 MG tablet Take 50 mg by mouth daily.   Yes [provider]    Family History Family History  Problem Relation Age of Onset   Hypertension Mother    Hyperlipidemia Father    Thyroid cancer Father    Hyperlipidemia Brother    Stroke Brother    Breast cancer Neg Hx    Colon cancer Neg Hx    Endometrial cancer Neg Hx    Ovarian cancer Neg Hx    Pancreatic cancer Neg Hx    Prostate cancer Neg Hx     Social History Social History   Tobacco Use   Smoking status: Never   Smokeless tobacco: Never  Vaping Use   Vaping status: Never Used  Substance Use Topics   Alcohol use: Yes    Comment: occasionally   Drug use: No     Allergies   Atorvastatin, Colesevelam hcl, Enalapril, Simvastatin, and Empagliflozin   Review of Systems Review of Systems Per HPI  Physical Exam Triage Vital Signs ED Triage Vitals  Encounter Vitals Group     BP 02/21/23 0944 (!) 144/79     Systolic BP Percentile --      Diastolic BP Percentile --      Pulse Rate 02/21/23 0944 77     Resp 02/21/23 0944 18     Temp 02/21/23 0944 98 F (36.7 C)     Temp src --      SpO2 02/21/23 0944 97 %     Weight 02/21/23 0943 140 lb (63.5 kg)     Height --      Head Circumference --      Peak Flow --      Pain Score 02/21/23 0943 0     Pain Loc --      Pain Education --      Exclude from Growth Chart --    No data found.  Updated Vital Signs BP (!) 144/79 (BP Location: Left  Arm)   Pulse 77   Temp 98 F (36.7 C)   Resp 18   Wt 140 lb (63.5 kg)   SpO2 97%   BMI 27.34 kg/m   Visual Acuity Right Eye Distance:   Left Eye Distance:   Bilateral  Distance:    Right Eye Near:   Left Eye Near:    Bilateral Near:     Physical Exam Constitutional:      General: She is not in acute distress.    Appearance: Normal appearance. She is not toxic-appearing or diaphoretic.  HENT:     Head: Normocephalic and atraumatic.     Right Ear: Tympanic membrane and ear canal normal.     Left Ear: Tympanic membrane and ear canal normal.     Nose: No congestion.     Mouth/Throat:     Mouth: Mucous membranes are moist.     Pharynx: No posterior oropharyngeal erythema.  Eyes:     Extraocular Movements: Extraocular movements intact.     Conjunctiva/sclera: Conjunctivae normal.     Pupils: Pupils are equal, round, and reactive to light.  Cardiovascular:     Rate and Rhythm: Normal rate and regular rhythm.     Pulses: Normal pulses.     Heart sounds: Normal heart sounds.  Pulmonary:     Effort: Pulmonary effort is normal. No respiratory distress.     Breath sounds: Normal breath sounds. No stridor. No wheezing, rhonchi or rales.  Abdominal:     General: Abdomen is flat. Bowel sounds are normal.     Palpations: Abdomen is soft.  Musculoskeletal:        General: Normal range of motion.     Cervical back: Normal range of motion.  Skin:    General: Skin is warm and dry.  Neurological:     General: No focal deficit present.     Mental Status: She is alert and oriented to person, place, and time. Mental status is at baseline.  Psychiatric:        Mood and Affect: Mood normal.        Behavior: Behavior normal.      UC Treatments / Results  Labs (all labs ordered are listed, but only abnormal results are displayed) Labs Reviewed  SARS CORONAVIRUS 2 (TAT 6-24 HRS)    EKG   Radiology No results found.  Procedures Procedures (including critical care  time)  Medications Ordered in UC Medications - No data to display  Initial Impression / Assessment and Plan / UC Course  I have reviewed the triage vital signs and the nursing notes.  Pertinent labs & imaging results that were available during my care of the patient were reviewed by me and considered in my medical decision making (see chart for details).     Patient here with persistent COVID-19 symptoms.  There are no adventitious lung sounds on exam so do not think that chest imaging is necessary.  Suspect patient has a postviral cough so will treat with benzonatate.  Requesting repeat COVID testing for work and clearance to return to work.  Advised patient that COVID test may still be positive but that guidelines are to wear a mask for 10 days from when symptoms started and to be fever free for 24 hours.  Advised strict follow precautions if any symptoms persist.  Patient verbalized understanding and was agreeable with plan. Final Clinical Impressions(s) / UC Diagnoses   Final diagnoses:  COVID-19     Discharge Instructions      Covid  test is pending.  Cough medication has been prescribed.    ED Prescriptions     Medication Sig Dispense Auth. Provider   benzonatate (TESSALON) 100 MG capsule Take 1 capsule (100 mg total) by mouth every 8 (eight) hours as  needed for cough. 21 capsule [place], Acie Fredrickson, [place]      PDMP not reviewed this encounter.   Gustavus Bryant, [place] 02/21/23 1012

## 2023-02-21 NOTE — Discharge Instructions (Signed)
Covid  test is pending.  Cough medication has been prescribed.

## 2023-02-21 NOTE — ED Triage Notes (Signed)
Patient states that she tested positive Sunday. Here to be retested. Feeling better. Needs note to go back to work.

## 2023-02-22 LAB — SARS CORONAVIRUS 2 (TAT 6-24 HRS): SARS Coronavirus 2: POSITIVE — AB

## 2023-07-14 ENCOUNTER — Telehealth: Payer: Self-pay

## 2023-07-14 ENCOUNTER — Telehealth: Payer: Self-pay | Admitting: *Deleted

## 2023-07-14 ENCOUNTER — Encounter: Payer: Self-pay | Admitting: Obstetrics & Gynecology

## 2023-07-14 NOTE — Telephone Encounter (Addendum)
 Pt is scheduled with Dr.Jackson-Moore on 07/22/23 @ 3:15.

## 2023-07-14 NOTE — Telephone Encounter (Signed)
 LMOM for the patient to call the office back to set up a follow up appt with Dr Tamela Oddi in Jan

## 2023-07-22 ENCOUNTER — Encounter: Payer: Self-pay | Admitting: Obstetrics & Gynecology

## 2023-07-22 ENCOUNTER — Inpatient Hospital Stay: Payer: Self-pay | Attending: Obstetrics & Gynecology | Admitting: Obstetrics & Gynecology

## 2023-07-22 VITALS — BP 136/74 | HR 88 | Temp 98.3°F | Resp 16 | Wt 136.8 lb

## 2023-07-22 DIAGNOSIS — Z8542 Personal history of malignant neoplasm of other parts of uterus: Secondary | ICD-10-CM | POA: Insufficient documentation

## 2023-07-22 DIAGNOSIS — C541 Malignant neoplasm of endometrium: Secondary | ICD-10-CM

## 2023-07-22 NOTE — Progress Notes (Signed)
 Follow Up Note: Gyn-Onc  Kirsten Hodges 63 y.o. female  CC: She presents for a f/u visit   HPI: The oncology history was reviewed.  Interval History:  She denies any vaginal bleeding, abdominal/pelvic pain, cough, lethargy or increasing abdominal girth. She reported no new health issues since her last visit. The patient mentioned a recent cold and sinus symptoms, but did not express any significant concern about these symptoms.   Review of Systems  Review of Systems  Constitutional:  Negative for malaise/fatigue and weight loss.  Respiratory:  Negative for shortness of breath and wheezing.   Cardiovascular:  Negative for chest pain and leg swelling.  Gastrointestinal:  Negative for abdominal pain, blood in stool, constipation, nausea and vomiting.  Genitourinary:  Negative for dysuria, frequency, hematuria and urgency.  Musculoskeletal:  Negative for joint pain and myalgias.  Neurological:  Negative for weakness.  Psychiatric/Behavioral:  Negative for depression. The patient does not have insomnia.    Current medications, allergy, social history, past surgical history, past medical history, family history were all reviewed.    Vitals:  BP 136/74 (BP Location: Left Arm, Patient Position: Sitting)   Pulse 88   Temp 98.3 F (36.8 C) (Oral)   Resp 16   Wt 136 lb 12.8 oz (62.1 kg)   SpO2 98%   BMI 26.72 kg/m    Physical Exam:  Physical Exam Exam conducted with a chaperone present.  Constitutional:      General: She is not in acute distress. Cardiovascular:     Rate and Rhythm: Normal rate and regular rhythm.  Pulmonary:     Effort: Pulmonary effort is normal.     Breath sounds: Normal breath sounds. No wheezing or rhonchi.  Abdominal:     Palpations: Abdomen is soft.     Tenderness: There is no abdominal tenderness. There is no right CVA tenderness or left CVA tenderness.     Hernia: No hernia is present.  Genitourinary:    General: Normal vulva.      Urethra: No urethral lesion.     Vagina: No lesions. No bleeding.  Small erythema, synechiae at the apex.  Thick yellow discharge.  No palpable abnormality. Musculoskeletal:     Cervical back: Neck supple.     Right lower leg: No edema.     Left lower leg: No edema.  Lymphadenopathy:     Upper Body:     Right upper body: No supraclavicular adenopathy.     Left upper body: No supraclavicular adenopathy.     Lower Body: No right inguinal adenopathy. No left inguinal adenopathy.  Skin:    Findings: No rash.  Neurological:     Mental Status: She is oriented to person, place, and time.   Assessment/Plan:  Endometrial cancer (HCC) H/O stage IA gr 1 EC, MMR undetermined Negative symptom review.  No evidence of recurrence   >continue f/u q 6 mos x 5 yrs, alternating w/referring gynecologist in accordance w/NCCN guidelines    I personally spent 25 minutes face-to-face and non-face-to-face in the care of this patient, which includes all pre, intra, and post visit time on the date of service.    [place], MD

## 2023-07-22 NOTE — Assessment & Plan Note (Signed)
H/O stage IA gr 1 EC, MMR undetermined Negative symptom review.  No evidence of recurrence   >continue f/u q 6 mos x 5 yrs, alternating w/referring gynecologist in accordance w/NCCN guidelines

## 2023-07-22 NOTE — Patient Instructions (Signed)
 Return in 1 year ?

## 2024-04-06 ENCOUNTER — Other Ambulatory Visit: Payer: Self-pay

## 2024-04-06 ENCOUNTER — Encounter (HOSPITAL_COMMUNITY): Payer: Self-pay

## 2024-04-06 ENCOUNTER — Emergency Department (HOSPITAL_COMMUNITY)
Admission: EM | Admit: 2024-04-06 | Discharge: 2024-04-06 | Disposition: A | Discharge status: Home / Self Care | Attending: Emergency Medicine | Admitting: Emergency Medicine

## 2024-04-06 ENCOUNTER — Emergency Department (HOSPITAL_COMMUNITY)

## 2024-04-06 DIAGNOSIS — M545 Low back pain, unspecified: Secondary | ICD-10-CM | POA: Diagnosis not present

## 2024-04-06 DIAGNOSIS — S299XXA Unspecified injury of thorax, initial encounter: Secondary | ICD-10-CM | POA: Diagnosis not present

## 2024-04-06 DIAGNOSIS — Y92410 Unspecified street and highway as the place of occurrence of the external cause: Secondary | ICD-10-CM | POA: Insufficient documentation

## 2024-04-06 DIAGNOSIS — S3991XA Unspecified injury of abdomen, initial encounter: Secondary | ICD-10-CM | POA: Insufficient documentation

## 2024-04-06 DIAGNOSIS — M549 Dorsalgia, unspecified: Secondary | ICD-10-CM

## 2024-04-06 DIAGNOSIS — S0990XA Unspecified injury of head, initial encounter: Secondary | ICD-10-CM | POA: Insufficient documentation

## 2024-04-06 LAB — I-STAT CHEM 8, ED
BUN: 14 mg/dL (ref 8–23)
BUN: 15 mg/dL (ref 8–23)
Calcium, Ion: 1.07 mmol/L — ABNORMAL LOW (ref 1.15–1.40)
Calcium, Ion: 1.08 mmol/L — ABNORMAL LOW (ref 1.15–1.40)
Chloride: 105 mmol/L (ref 98–111)
Chloride: 105 mmol/L (ref 98–111)
Creatinine, Ser: 0.4 mg/dL — ABNORMAL LOW (ref 0.44–1.00)
Creatinine, Ser: 0.4 mg/dL — ABNORMAL LOW (ref 0.44–1.00)
Glucose, Bld: 130 mg/dL — ABNORMAL HIGH (ref 70–99)
Glucose, Bld: 129 mg/dL — ABNORMAL HIGH (ref 70–99)
HCT: 40 % (ref 36.0–46.0)
HCT: 40 % (ref 36.0–46.0)
Hemoglobin: 13.6 g/dL (ref 12.0–15.0)
Hemoglobin: 13.6 g/dL (ref 12.0–15.0)
Potassium: 3.4 mmol/L — ABNORMAL LOW (ref 3.5–5.1)
Potassium: 3.4 mmol/L — ABNORMAL LOW (ref 3.5–5.1)
Sodium: 141 mmol/L (ref 135–145)
Sodium: 142 mmol/L (ref 135–145)
TCO2: 21 mmol/L — ABNORMAL LOW (ref 22–32)
TCO2: 22 mmol/L (ref 22–32)

## 2024-04-06 MED ORDER — IOHEXOL 350 MG/ML SOLN
75.0000 mL | Freq: Once | INTRAVENOUS | Status: AC | PRN
  Administered 2024-04-06: 75 mL via INTRAVENOUS
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
## 2024-04-15 ENCOUNTER — Other Ambulatory Visit: Payer: Self-pay | Admitting: Internal Medicine

## 2024-04-15 DIAGNOSIS — K7469 Other cirrhosis of liver: Secondary | ICD-10-CM

## 2024-04-21 ENCOUNTER — Ambulatory Visit
Admission: RE | Admit: 2024-04-21 | Discharge: 2024-04-21 | Disposition: A | Discharge status: Home / Self Care | Source: Ambulatory Visit | Attending: Internal Medicine | Admitting: Internal Medicine

## 2024-04-21 DIAGNOSIS — K7469 Other cirrhosis of liver: Secondary | ICD-10-CM

## 2024-04-25 ENCOUNTER — Other Ambulatory Visit: Payer: Self-pay | Admitting: Nurse Practitioner

## 2024-04-25 DIAGNOSIS — R932 Abnormal findings on diagnostic imaging of liver and biliary tract: Secondary | ICD-10-CM

## 2024-05-05 ENCOUNTER — Other Ambulatory Visit

## 2024-05-06 ENCOUNTER — Ambulatory Visit
Admission: RE | Admit: 2024-05-06 | Discharge: 2024-05-06 | Disposition: A | Discharge status: Home / Self Care | Source: Ambulatory Visit | Attending: Nurse Practitioner | Admitting: Nurse Practitioner

## 2024-05-06 DIAGNOSIS — R932 Abnormal findings on diagnostic imaging of liver and biliary tract: Secondary | ICD-10-CM

## 2024-07-21 ENCOUNTER — Telehealth: Payer: Self-pay | Admitting: *Deleted

## 2024-07-21 NOTE — Telephone Encounter (Signed)
 Spoke with patient who called to scheduled her 1 year follow up with Dr. Rogelio. Pt was scheduled on August 10, 2024 at 2:45 pm. Pt agreed to date and time and thanked the office.

## 2024-08-10 ENCOUNTER — Inpatient Hospital Stay: Admitting: Obstetrics & Gynecology

## 2024-08-12 ENCOUNTER — Encounter: Payer: Self-pay | Admitting: Obstetrics & Gynecology

## 2024-08-12 ENCOUNTER — Inpatient Hospital Stay: Attending: Obstetrics & Gynecology | Admitting: Obstetrics & Gynecology

## 2024-08-12 VITALS — BP 120/56 | HR 98 | Temp 98.5°F | Resp 18 | Wt 135.6 lb

## 2024-08-12 DIAGNOSIS — Z8542 Personal history of malignant neoplasm of other parts of uterus: Secondary | ICD-10-CM | POA: Insufficient documentation

## 2024-08-12 DIAGNOSIS — C541 Malignant neoplasm of endometrium: Secondary | ICD-10-CM

## 2024-08-12 NOTE — Assessment & Plan Note (Signed)
H/O stage IA gr 1 EC, MMR undetermined Negative symptom review.  No evidence of recurrence   >continue f/u q 6 mos x 5 yrs, alternating w/referring gynecologist in accordance w/NCCN guidelines

## 2024-08-12 NOTE — Patient Instructions (Addendum)
" °  VISIT SUMMARY: You reported no vaginal bleeding, pelvic pain, or other issues. Your weight is currently 135 pounds, and you mentioned a recent increase in weight. You have no problems with energy levels, cough, bowel movements, or urination.  YOUR PLAN:  -ENDOMETRIAL CANCER:  You have no current symptoms or concerns related to this condition. Continue to monitor for any unusual symptoms and report them if they occur.  Return to this clinic in 1 year. Please call in the fall to schedule a follow up with Dr.Jackson-Moore in January.      Contains text generated by [employer].   "

## 2024-08-12 NOTE — Progress Notes (Signed)
 Follow Up Note: Gyn-Onc  Kirsten Hodges 64 y.o. female  CC: She presents for a f/u visit   HPI: The oncology history was reviewed.  Interval History:  She denies any vaginal bleeding, abdominal/pelvic pain, cough, lethargy or increasing abdominal girth.    Review of Systems  Review of Systems  Constitutional:  Negative for malaise/fatigue and weight loss.  Respiratory:  Negative for shortness of breath and wheezing.   Cardiovascular:  Negative for chest pain and leg swelling.  Gastrointestinal:  Negative for abdominal pain, blood in stool, constipation, nausea and vomiting.  Genitourinary:  Negative for dysuria, frequency, hematuria and urgency.  Musculoskeletal:  Negative for joint pain and myalgias.  Neurological:  Negative for weakness.  Psychiatric/Behavioral:  Negative for depression. The patient does not have insomnia.    Current medications, allergy, social history, past surgical history, past medical history, family history were all reviewed.    Vitals:  BP (!) 120/56 (BP Location: Right Arm, Patient Position: Sitting)   Pulse 98   Temp 98.5 F (36.9 C) (Oral)   Resp 18   Wt 135 lb 9.6 oz (61.5 kg)   SpO2 99%   BMI 26.48 kg/m    Physical Exam:  Physical Exam Exam conducted with a chaperone present.  Constitutional:      General: She is not in acute distress. Cardiovascular:     Rate and Rhythm: Normal rate and regular rhythm.  Pulmonary:     Effort: Pulmonary effort is normal.     Breath sounds: Normal breath sounds. No wheezing or rhonchi.  Abdominal:     Palpations: Abdomen is soft.     Tenderness: There is no abdominal tenderness. There is no right CVA tenderness or left CVA tenderness.     Hernia: No hernia is present.  Genitourinary:    General: Normal vulva.     Urethra: No urethral lesion.     Vagina: No lesions. No bleeding.  Small erythema, synechiae at the apex.  Thick yellow discharge.  No palpable abnormality. Musculoskeletal:      Cervical back: Neck supple.     Right lower leg: No edema.     Left lower leg: No edema.  Lymphadenopathy:     Upper Body:     Right upper body: No supraclavicular adenopathy.     Left upper body: No supraclavicular adenopathy.     Lower Body: No right inguinal adenopathy. No left inguinal adenopathy.  Skin:    Findings: No rash.  Neurological:     Mental Status: She is oriented to person, place, and time.   Assessment/Plan:  Endometrial cancer (HCC) H/O stage IA gr 1 EC, MMR undetermined Negative symptom review.  No evidence of recurrence   >continue f/u q 6 mos x 5 yrs, alternating w/referring gynecologist in accordance w/NCCN guidelines     I personally spent 25 minutes face-to-face and non-face-to-face in the care of this patient, which includes all pre, intra, and post visit time on the date of service.    Olam Mill, MD
